# Patient Record
Sex: Female | Born: 1935 | Race: White | Hispanic: No | Smoking: Former smoker
Health system: Southern US, Community
[De-identification: ages and names within clinical notes are randomized; demographics above are authoritative.]

## PROBLEM LIST (undated history)

## (undated) DIAGNOSIS — S82891A Other fracture of right lower leg, initial encounter for closed fracture: Secondary | ICD-10-CM

## (undated) DIAGNOSIS — C801 Malignant (primary) neoplasm, unspecified: Secondary | ICD-10-CM

## (undated) HISTORY — DX: Malignant (primary) neoplasm, unspecified: C80.1

## (undated) HISTORY — DX: Other fracture of right lower leg, initial encounter for closed fracture: S82.891A

## (undated) HISTORY — PX: ABDOMINAL HYSTERECTOMY: SHX81

---

## 1982-06-24 DIAGNOSIS — S82891A Other fracture of right lower leg, initial encounter for closed fracture: Secondary | ICD-10-CM

## 1982-06-24 HISTORY — DX: Other fracture of right lower leg, initial encounter for closed fracture: S82.891A

## 2003-06-25 HISTORY — PX: EYE SURGERY: SHX253

## 2004-06-24 DIAGNOSIS — C801 Malignant (primary) neoplasm, unspecified: Secondary | ICD-10-CM

## 2004-06-24 HISTORY — DX: Malignant (primary) neoplasm, unspecified: C80.1

## 2011-09-04 ENCOUNTER — Encounter: Payer: Self-pay | Admitting: Internal Medicine

## 2011-09-04 ENCOUNTER — Ambulatory Visit (INDEPENDENT_AMBULATORY_CARE_PROVIDER_SITE_OTHER): Payer: Medicare Other | Admitting: Internal Medicine

## 2011-09-04 DIAGNOSIS — R32 Unspecified urinary incontinence: Secondary | ICD-10-CM | POA: Insufficient documentation

## 2011-09-04 DIAGNOSIS — G5630 Lesion of radial nerve, unspecified upper limb: Secondary | ICD-10-CM

## 2011-09-04 DIAGNOSIS — E1165 Type 2 diabetes mellitus with hyperglycemia: Secondary | ICD-10-CM | POA: Insufficient documentation

## 2011-09-04 DIAGNOSIS — I83003 Varicose veins of unspecified lower extremity with ulcer of ankle: Secondary | ICD-10-CM

## 2011-09-04 DIAGNOSIS — Z85118 Personal history of other malignant neoplasm of bronchus and lung: Secondary | ICD-10-CM

## 2011-09-04 DIAGNOSIS — IMO0002 Reserved for concepts with insufficient information to code with codable children: Secondary | ICD-10-CM | POA: Insufficient documentation

## 2011-09-04 DIAGNOSIS — M810 Age-related osteoporosis without current pathological fracture: Secondary | ICD-10-CM | POA: Insufficient documentation

## 2011-09-04 DIAGNOSIS — E559 Vitamin D deficiency, unspecified: Secondary | ICD-10-CM

## 2011-09-04 DIAGNOSIS — L97309 Non-pressure chronic ulcer of unspecified ankle with unspecified severity: Secondary | ICD-10-CM

## 2011-09-04 DIAGNOSIS — E119 Type 2 diabetes mellitus without complications: Secondary | ICD-10-CM

## 2011-09-04 DIAGNOSIS — S82891A Other fracture of right lower leg, initial encounter for closed fracture: Secondary | ICD-10-CM | POA: Insufficient documentation

## 2011-09-04 MED ORDER — "AQUACEL FOAM 4""X4"" EX PADS": 1.0000 "application " | MEDICATED_PAD | CUTANEOUS | Status: DC | PRN
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
## 2011-09-04 NOTE — Patient Instructions (Addendum)
I am concerned about your low morning glucose .Marland Kitchen Add 1 ounce of cheese or an egg white to your breakfast and don't take your insulin until just after you eat .  Sandwich thins are lower in carbohydrates than regular bread slices.   Return at your lesiure for fasting labs..  Referrals to Wound Care  [facility],  And [facility] are in process for you.   You should keep your wound out of the bathtub and shower an duse the aquacel dressings to keep the mositure balanced until the wound care cneter can see you

## 2011-09-05 DIAGNOSIS — Z85118 Personal history of other malignant neoplasm of bronchus and lung: Secondary | ICD-10-CM | POA: Insufficient documentation

## 2011-09-05 DIAGNOSIS — L97909 Non-pressure chronic ulcer of unspecified part of unspecified lower leg with unspecified severity: Secondary | ICD-10-CM | POA: Insufficient documentation

## 2011-09-05 NOTE — Assessment & Plan Note (Signed)
She is requesting referral to urogynecology for persistent incontinence. She does have a history of cystocele.

## 2011-09-05 NOTE — Assessment & Plan Note (Signed)
Her venous ulcer is macerated and chronic secondary to lack of moisture balance and persistent edema. I have ordered aqua cell antigens were used and they recommended that she avoid getting her leg wet in the shower or in the bathtub to protect it with a plastic bag.  It concerns me that that this has not been biopsied before since it has been chronic for many years. I'm referring her to the wound care center for assessment and treatment.

## 2011-09-05 NOTE — Assessment & Plan Note (Signed)
Managed with bisphosphonate therapy. She has been taking medication for 3 years and her son and daughter-in-law decided to stop her therapy at this time. We will get a baseline DEXA scan this year.

## 2011-09-05 NOTE — Assessment & Plan Note (Signed)
She has been no palsy of the right hand which occurred approximately one year ago during compression event overnight. Unfortunately she did not have physical therapy at the time and appears to have a contraction of the thumb resulting from the thenar muscle atrophy. Nevertheless she is requesting a neurology consult which we will obtain at [facility] since she is being referred to [facility] for urogynecologic issues as well.

## 2011-09-05 NOTE — Assessment & Plan Note (Signed)
Managed with sliding scale insulin 3 times daily and Lantus. Have addressed her overzealous management and warned her that episodes of hypoglycemia or more damaging at this point here I have been hyper. Hemoglobin A1c ordered for today

## 2011-09-05 NOTE — Assessment & Plan Note (Signed)
Etiology unclear, but  given her history of treatment with chemotherapy and radiation as opposed to resection I suspect this was small cell. Her oncologist is Dr. Ree Kida.  Will referred to Christian Mate at [facility] for followup.

## 2011-09-17 ENCOUNTER — Encounter: Payer: Self-pay | Admitting: Nurse Practitioner

## 2011-09-17 ENCOUNTER — Encounter: Payer: Self-pay | Admitting: Cardiothoracic Surgery

## 2011-09-19 ENCOUNTER — Encounter: Payer: Self-pay | Admitting: Internal Medicine

## 2011-09-23 ENCOUNTER — Encounter: Payer: Self-pay | Admitting: Cardiothoracic Surgery

## 2011-09-23 ENCOUNTER — Encounter: Payer: Self-pay | Admitting: Nurse Practitioner

## 2011-09-25 LAB — WOUND CULTURE

## 2011-09-27 ENCOUNTER — Telehealth: Payer: Self-pay | Admitting: Internal Medicine

## 2011-09-27 ENCOUNTER — Encounter: Payer: Self-pay | Admitting: Internal Medicine

## 2011-09-27 NOTE — Telephone Encounter (Signed)
Patients son called stated there were some labs you wanted patient to have done.  He wanted to know if she could have them done at [facility] [place].  Please advise and I will send the order to [facility].

## 2011-09-27 NOTE — Telephone Encounter (Signed)
Hgba1c, urine micro alb /Cr ratio,  CMET , TSH , CBC and fasting lipids

## 2011-09-30 NOTE — Telephone Encounter (Signed)
Letter has been printed, and sent to [facility]

## 2011-10-03 ENCOUNTER — Telehealth: Payer: Self-pay | Admitting: Internal Medicine

## 2011-10-03 MED ORDER — INSULIN GLARGINE 100 UNIT/ML ~~LOC~~ SOLN: 20.0000 [IU] | Freq: Every day | SUBCUTANEOUS | Status: DC

## 2011-10-03 NOTE — Telephone Encounter (Signed)
Recent labs reviewed, Her diabetes is not well controlled. hgba1c is 8.1 I would like her to change her lantus dose to 20 units daily instead of 10 units and 5 units.  It does not need to be given in 2 doses

## 2011-10-04 ENCOUNTER — Encounter: Payer: Self-pay | Admitting: Internal Medicine

## 2011-10-04 ENCOUNTER — Ambulatory Visit (INDEPENDENT_AMBULATORY_CARE_PROVIDER_SITE_OTHER): Payer: Medicare Other | Admitting: Internal Medicine

## 2011-10-04 VITALS — BP 122/66 | HR 104 | Temp 98.2°F | Resp 18 | Ht 62.0 in | Wt 103.5 lb

## 2011-10-04 DIAGNOSIS — F341 Dysthymic disorder: Secondary | ICD-10-CM

## 2011-10-04 DIAGNOSIS — L97309 Non-pressure chronic ulcer of unspecified ankle with unspecified severity: Secondary | ICD-10-CM

## 2011-10-04 DIAGNOSIS — I83003 Varicose veins of unspecified lower extremity with ulcer of ankle: Secondary | ICD-10-CM

## 2011-10-04 DIAGNOSIS — F418 Other specified anxiety disorders: Secondary | ICD-10-CM

## 2011-10-04 DIAGNOSIS — E119 Type 2 diabetes mellitus without complications: Secondary | ICD-10-CM

## 2011-10-04 MED ORDER — ESCITALOPRAM OXALATE 10 MG PO TABS: 10.0000 mg | ORAL_TABLET | Freq: Every day | ORAL | Status: DC

## 2011-10-04 NOTE — Patient Instructions (Signed)
I am not changing your insulin dose for now but would like you to keep a log of your blood sugars and the times you are taking them.  If you continue to have early morning highs (> 150),  Please check a 3 am sugar to make sure you are  not bottoming out   Consider supplements like atkins bars or carnation instant breakfast drink (consider almond milk instead of regular)   We are trying lexapro to help your mood improve.  Start with 1/2 tablet for one week  Take after dinner, unless it interfere with sleep .   Increase to one full tablet after one week.

## 2011-10-04 NOTE — Telephone Encounter (Signed)
Patient notified at her office visit. 

## 2011-10-04 NOTE — Assessment & Plan Note (Addendum)
We discussed the use of medications to manage her persistent symptoms which occurred after her husband passed away in 12/10/2008. She has accepted the offer to for a trial of Lexapro . She will return in one month. She is to start this medication at half tablet daily for a week followed by full increased to one whole tablet at one week if tolerated.
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
## 2011-10-04 NOTE — Telephone Encounter (Signed)
Left message asking patient to return my call.

## 2011-10-06 ENCOUNTER — Encounter: Payer: Self-pay | Admitting: Internal Medicine

## 2011-10-06 NOTE — Assessment & Plan Note (Signed)
Given her recurrent lows and poor eating habits I haven't opted not to change her medications today I have asked to return with the log of her meals and blood sugars and when I see her again in one month we might make some adjustments to her regimen.

## 2011-10-06 NOTE — Assessment & Plan Note (Addendum)
Improved with management  By the [facility] Wound healing center  after initial evaluation here.

## 2011-10-11 ENCOUNTER — Telehealth: Payer: Self-pay | Admitting: Internal Medicine

## 2011-10-11 ENCOUNTER — Encounter: Payer: Self-pay | Admitting: Internal Medicine

## 2011-10-11 NOTE — Telephone Encounter (Signed)
I recommend suspending the medication for a week and resuming at 1/4 tablet daily after that,.  If symptoms recur,.  Stop it.

## 2011-10-11 NOTE — Telephone Encounter (Signed)
Patients son called and stated he was concerned over the Lexapro that was prescribed.  He stated it is making the patient extremely fatigued and she has had some hand numbness.  I advised to stop taking the medication, he stated she definitely needs something but not the Lexapro.  Please advise.

## 2011-10-11 NOTE — Telephone Encounter (Signed)
[facility] Triage Call Report Triage Record Num: [id number] Operator: Patriciaann Clan Patient Name: Penny Clements Call Date & Time: 10/11/2011 10:10:10AM Patient Phone: [phone] PCP: Duncan Dull Patient Gender: Female PCP Fax : [phone] Patient DOB: 1928/01/12 Practice Name: [facility] Reason for Call: Caller: Diane/Daughter-in-law; PCP: Duncan Dull; CB#: [phone]; Daughter-in-law calling. States patient was started on Lexapro 5mg . daily on 10/04/11. States increased fatigue noted after starting Lexapro. States patient developed numbness of hand/hands, (Caller unsure if unilateral or bilateral) onset 10/09/11. No weakness of extremities. No facial drooping. Patient ambulating without difficulty. Denies headache. States patient did not take Lexapro 10/10/11 due to sx. Daughter-in-law is uncertain if sx improved after with holding dosage. Triage per Numbness or Tingling Protocol. No emergent sx identified. Care advice given per guidelines. Call back parameters reviewed. Daughter-in-law verbalizes understanding. DAUGHTER-IN-LAW CALLING REGARDING PATINT DEVELOPING INCREASED FATIGUE AND NUMBNESS OF HAND/HANDS (CALLER UNSURE IF UNILATERAL OR BILATERAL) AFTER STARTING LEXAPRO 5MG . STATES DOSAGE OF LEXAPRO WAS HELD 10/10/11. DAUGHTER-IN-LAW REQUESTING RETURN CALL 07/13/11 WITH INSTRUCTIONS REGARDING LEXAPRO. DAUGHTER-IN-LAW CAN BE REACHED @ [phone]. Message sent to Dr. Darrick Huntsman via Epic EHR. Protocol(s) Used: Numbness or Tingling Recommended Outcome per Protocol: See Provider within 24 hours Reason for Outcome: Symptoms began after starting or changing dose of prescription, nonprescription, alternative medication, or illicit drug Care Advice: Call provider immediately if numbness or tingling suddenly worsen or cause inability to perform activities of daily living. ~ ~ SYMPTOM / CONDITION MANAGEMENT 10/11/2011 10:51:25AM Page 1 of 1 [id number]

## 2011-10-11 NOTE — Telephone Encounter (Signed)
Caller: Diane/Daughter-in-law; PCP: Duncan Dull; CB#: [phone];   Daughter-in-law calling. States patient was started on Lexapro 5mg . daily on 10/04/11. States increased fatigue noted after starting Lexapro. States patient developed numbness of hand/hands, (Caller unsure if unilateral or bilateral) onset 10/09/11. No weakness of extremities. No facial drooping. Patient ambulating without difficulty.  Denies headache. States patient did not take Lexapro 10/10/11 due to sx. Daughter-in-law is uncertain if sx improved after with holding dosage. Triage per Numbness or Tingling Protocol. No emergent sx identified. Care advice given per guidelines. Call back parameters reviewed. Daughter-in-law verbalizes understanding.  DAUGHTER-IN-LAW CALLING REGARDING PATINT DEVELOPING INCREASED FATIGUE AND NUMBNESS OF HAND/HANDS (CALLER UNSURE IF UNILATERAL OR BILATERAL) AFTER STARTING LEXAPRO 5MG . STATES DOSAGE OF LEXAPRO WAS HELD 10/10/11. DAUGHTER-IN-LAW REQUESTING RETURN CALL 07/13/11 WITH INSTRUCTIONS REGARDING LEXAPRO. DAUGHTER-IN-LAW CAN BE REACHED @ [phone].

## 2011-10-11 NOTE — Telephone Encounter (Signed)
Patient's son notified.

## 2011-10-23 ENCOUNTER — Encounter: Payer: Self-pay | Admitting: Cardiothoracic Surgery

## 2011-10-23 ENCOUNTER — Encounter: Payer: Self-pay | Admitting: Nurse Practitioner

## 2011-11-01 ENCOUNTER — Encounter: Payer: Self-pay | Admitting: Internal Medicine

## 2011-11-01 ENCOUNTER — Ambulatory Visit (INDEPENDENT_AMBULATORY_CARE_PROVIDER_SITE_OTHER): Payer: Medicare Other | Admitting: Internal Medicine

## 2011-11-01 VITALS — BP 118/56 | HR 89 | Temp 98.1°F | Resp 18 | Wt 109.2 lb

## 2011-11-01 DIAGNOSIS — L97309 Non-pressure chronic ulcer of unspecified ankle with unspecified severity: Secondary | ICD-10-CM

## 2011-11-01 DIAGNOSIS — I83003 Varicose veins of unspecified lower extremity with ulcer of ankle: Secondary | ICD-10-CM

## 2011-11-01 DIAGNOSIS — C801 Malignant (primary) neoplasm, unspecified: Secondary | ICD-10-CM | POA: Insufficient documentation

## 2011-11-01 DIAGNOSIS — F341 Dysthymic disorder: Secondary | ICD-10-CM

## 2011-11-01 DIAGNOSIS — R32 Unspecified urinary incontinence: Secondary | ICD-10-CM

## 2011-11-01 DIAGNOSIS — F418 Other specified anxiety disorders: Secondary | ICD-10-CM

## 2011-11-01 DIAGNOSIS — Z87898 Personal history of other specified conditions: Secondary | ICD-10-CM
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
## 2011-11-01 NOTE — Assessment & Plan Note (Signed)
Improving , nearly resolved with Wound management and leg elevation per [facility]

## 2011-11-01 NOTE — Assessment & Plan Note (Addendum)
Lexapro initiatied last visit.  She is taking 2.5 mg daily for the past 2 weeks, we discussed continuing current dose for 2 weeks more, if still fatigued,  Will try mirtazipine  7.5 mg daily

## 2011-11-01 NOTE — Assessment & Plan Note (Signed)
Combination stress/urge with cystocele .  Referral to [facility] Urogynecology discussed.   Son will handle.

## 2011-11-14 ENCOUNTER — Encounter: Payer: Self-pay | Admitting: Internal Medicine

## 2011-11-15 ENCOUNTER — Telehealth: Payer: Self-pay | Admitting: *Deleted

## 2011-11-15 DIAGNOSIS — F329 Major depressive disorder, single episode, unspecified: Secondary | ICD-10-CM

## 2011-11-15 MED ORDER — MIRTAZAPINE 7.5 MG PO TABS: 7.5000 mg | ORAL_TABLET | Freq: Every day | ORAL | Status: DC

## 2011-11-15 NOTE — Telephone Encounter (Signed)
Son called about email sent through My Chart: [can't print] [facility] MESSAGE REPORT  Message [[id number]]    From  Herschell Dimes   To  Sherlene Shams, MD [P [phone]   Composed  11/14/2011 5:15 PM   For Delivery On  11/14/2011 5:15 PM   Subject  Visit Follow-Up Question   Message Type  Patient Medical Advice Request   Read Status  N   Message Body  I'm frustrated by the difficulty in getting through with questions. I called this afternoon around 1:30 and received a call back 20 minutes later which went to voicemail. I called back immediately and left another message but did not hear back. At 4:30 I called again and left a message and did not hear back. I called at 5 and got the message that the office is closed.   I understand that the office is busy with patients, but there needs to be a more efficient way to get through with non-emergency follow up questions. In this case my mother is severely depressed and we need to determine medication changes and look into counseling. I'll call again tomorrow.   --Frederik Pear

## 2011-11-16 ENCOUNTER — Encounter: Payer: Self-pay | Admitting: Internal Medicine

## 2011-11-19 NOTE — Telephone Encounter (Signed)
I have called son and left vm asking him to return my call.  I have spoke with the [facility] and they stated that on Thursday Ashley W. Called and left vm once and then patient called back after she had left.  Jasmine December states that she took phone message off machine on Friday when she sent Dr. Darrick Huntsman a note since son wanted to talk with her.

## 2011-11-20 ENCOUNTER — Telehealth: Payer: Self-pay | Admitting: Internal Medicine

## 2011-11-20 NOTE — Telephone Encounter (Signed)
Dorene Sorrow patients son called and left a message on my phone that he was returning your call.  I called him back and left him a voicemail to let him know you had left for the evening and you would return his call tomorrow.

## 2011-11-23 ENCOUNTER — Encounter: Payer: Self-pay | Admitting: Cardiothoracic Surgery

## 2011-11-23 ENCOUNTER — Encounter: Payer: Self-pay | Admitting: Nurse Practitioner

## 2011-11-25 NOTE — Telephone Encounter (Signed)
I finally spoke with Dorene Sorrow and he said that the issue has been resolved from last week.  I did give him my direct extension and I would be glad to make sure he got a return call.  He took down the number and said he would call my extension when he had a question.

## 2012-01-24 ENCOUNTER — Ambulatory Visit: Payer: Medicare Other | Admitting: Internal Medicine

## 2012-01-24 ENCOUNTER — Ambulatory Visit (INDEPENDENT_AMBULATORY_CARE_PROVIDER_SITE_OTHER): Payer: Medicare Other | Admitting: Internal Medicine

## 2012-01-24 ENCOUNTER — Encounter: Payer: Self-pay | Admitting: Internal Medicine

## 2012-01-24 VITALS — BP 140/72 | HR 110 | Temp 98.1°F | Resp 20 | Wt 106.5 lb

## 2012-01-24 DIAGNOSIS — J40 Bronchitis, not specified as acute or chronic: Secondary | ICD-10-CM

## 2012-01-24 MED ORDER — BECLOMETHASONE DIPROPIONATE 40 MCG/ACT IN AERS: 2.0000 | INHALATION_SPRAY | Freq: Two times a day (BID) | RESPIRATORY_TRACT | Status: DC

## 2012-01-24 MED ORDER — AZITHROMYCIN 250 MG PO TABS: ORAL_TABLET | ORAL | Status: AC
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
## 2012-01-24 NOTE — Patient Instructions (Addendum)
Use delsym for the cough suppressant.   I am going to prescribe an antibioitc and an inhaler to use for your wheezing (one puff twice daily)   Flush your sinuses twice daily with Simply Saline   Rise your mouth after you use the inhaler, to prevent thrush (yeast infection of the tongue)

## 2012-01-26 ENCOUNTER — Encounter: Payer: Self-pay | Admitting: Internal Medicine

## 2012-01-26 DIAGNOSIS — J40 Bronchitis, not specified as acute or chronic: Secondary | ICD-10-CM | POA: Insufficient documentation

## 2012-01-26 NOTE — Assessment & Plan Note (Signed)
Given her persistent symptoms, wheezing on exam, and purulent sputum we'll treat with azithromycin and steroid inhaler for management of mild wheezing.

## 2012-02-28 ENCOUNTER — Encounter: Payer: Self-pay | Admitting: Internal Medicine

## 2012-02-28 ENCOUNTER — Telehealth: Payer: Self-pay | Admitting: Internal Medicine

## 2012-02-28 NOTE — Telephone Encounter (Signed)
[employer] Medical form for Diabetic testing supplies put in Dr. Melina Schools tray to be completed and faxed. Patient is almost out of supplies.

## 2012-03-03 ENCOUNTER — Telehealth: Payer: Self-pay | Admitting: Internal Medicine

## 2012-03-03 ENCOUNTER — Encounter: Payer: Self-pay | Admitting: Internal Medicine

## 2012-03-03 NOTE — Telephone Encounter (Signed)
Fine, 5 times a day  .  And she is overdue for her hgba1c which was done in April.

## 2012-03-03 NOTE — Telephone Encounter (Signed)
I called patient to let her know I faxed the form for her diabetic supplies and let her know that per Dr. Darrick Huntsman she should not be testing her blood sugar more than 3 times a day (she is currently testing 5 times a day).  She stated she has been testing this way since the early 90s and she will not change this now.  She stated she has to test her blood sugars 5 times a day because they fluctuate so much.  She kept saying "I can't change, I'm sorry but I can't", "I am comfortable testing that way", I intend to continue like this until I die".

## 2012-03-04 NOTE — Telephone Encounter (Signed)
Patient notified.  She will call back to schedule the lab appt.

## 2012-03-11 ENCOUNTER — Telehealth: Payer: Self-pay | Admitting: Internal Medicine

## 2012-03-11 ENCOUNTER — Encounter: Payer: Self-pay | Admitting: Internal Medicine

## 2012-03-11 MED ORDER — "INSULIN SYRINGE-NEEDLE U-100 31G X 5/16"" 1 ML MISC": Status: DC

## 2012-03-11 NOTE — Telephone Encounter (Signed)
Recent labs reviewed.  hgba1c 7.6  is better,   Renal function is stable.

## 2012-03-12 NOTE — Telephone Encounter (Signed)
Patient read My Chart message.

## 2012-03-20 ENCOUNTER — Encounter: Payer: Self-pay | Admitting: Internal Medicine

## 2012-03-31 ENCOUNTER — Encounter: Payer: Self-pay | Admitting: Internal Medicine

## 2012-04-01 ENCOUNTER — Encounter: Payer: Self-pay | Admitting: Internal Medicine

## 2012-04-09 ENCOUNTER — Encounter: Payer: Self-pay | Admitting: Internal Medicine

## 2012-04-09 DIAGNOSIS — M6281 Muscle weakness (generalized): Secondary | ICD-10-CM

## 2012-04-14 ENCOUNTER — Ambulatory Visit (INDEPENDENT_AMBULATORY_CARE_PROVIDER_SITE_OTHER): Payer: Medicare Other | Admitting: Internal Medicine

## 2012-04-14 ENCOUNTER — Encounter: Payer: Self-pay | Admitting: Internal Medicine

## 2012-04-14 VITALS — BP 118/62 | HR 95 | Temp 97.5°F | Ht 62.5 in | Wt 109.5 lb

## 2012-04-14 DIAGNOSIS — L8990 Pressure ulcer of unspecified site, unspecified stage: Secondary | ICD-10-CM

## 2012-04-14 DIAGNOSIS — L89609 Pressure ulcer of unspecified heel, unspecified stage: Secondary | ICD-10-CM

## 2012-04-14 DIAGNOSIS — I83009 Varicose veins of unspecified lower extremity with ulcer of unspecified site: Secondary | ICD-10-CM

## 2012-04-14 MED ORDER — CEPHALEXIN 500 MG PO CAPS: 500.0000 mg | ORAL_CAPSULE | Freq: Three times a day (TID) | ORAL | Status: DC

## 2012-04-14 NOTE — Patient Instructions (Addendum)
Please continue to eat yogurt and add align, one capsule daily while you are taking the antibiotic (cephalexin)
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
## 2012-04-16 ENCOUNTER — Encounter: Payer: Self-pay | Admitting: Internal Medicine

## 2012-04-16 DIAGNOSIS — L89609 Pressure ulcer of unspecified heel, unspecified stage: Secondary | ICD-10-CM | POA: Insufficient documentation

## 2012-04-16 NOTE — Assessment & Plan Note (Signed)
Stage 1 .  Advised to float heels while resting in recliner. Continue duoderm dressing pending Wound Care evaluation

## 2012-04-16 NOTE — Assessment & Plan Note (Signed)
Stage 3 at the least,  appears to have tendon exposure.  Given the erythema surrounding the wound, I have prescribed cephalexin x 7 days and advised to suspend use of compression stockings until wound heals.  Wound Care referral made.

## 2012-04-21 ENCOUNTER — Encounter: Payer: Self-pay | Admitting: Nurse Practitioner

## 2012-04-21 ENCOUNTER — Encounter: Payer: Self-pay | Admitting: Cardiothoracic Surgery

## 2012-04-24 ENCOUNTER — Encounter: Payer: Self-pay | Admitting: Cardiothoracic Surgery

## 2012-04-24 ENCOUNTER — Encounter: Payer: Self-pay | Admitting: Nurse Practitioner

## 2012-05-24 ENCOUNTER — Encounter: Payer: Self-pay | Admitting: Nurse Practitioner

## 2012-05-24 ENCOUNTER — Encounter: Payer: Self-pay | Admitting: Cardiothoracic Surgery

## 2012-07-20 ENCOUNTER — Other Ambulatory Visit: Payer: Self-pay | Admitting: *Deleted

## 2012-07-21 MED ORDER — DILTIAZEM HCL ER 120 MG PO CP24: 120.0000 mg | ORAL_CAPSULE | Freq: Every day | ORAL | Status: DC

## 2012-07-21 NOTE — Telephone Encounter (Signed)
Med filled.  

## 2012-08-08 ENCOUNTER — Other Ambulatory Visit: Payer: Self-pay

## 2012-08-14 ENCOUNTER — Telehealth: Payer: Self-pay | Admitting: General Practice

## 2012-08-14 ENCOUNTER — Encounter: Payer: Self-pay | Admitting: General Practice

## 2012-08-14 NOTE — Telephone Encounter (Signed)
Yes to both.  Please give them a verbal

## 2012-08-14 NOTE — Telephone Encounter (Signed)
Letter written and faxed.

## 2012-08-14 NOTE — Telephone Encounter (Signed)
[facility] called regarding pt Hand Therapy that was written back in [facility]. Has expired they are wanting to know if it can be renewed due to hand weakness. Also pt is wanting therapy for balance issues.   Please fax to [phone]

## 2012-08-20 ENCOUNTER — Other Ambulatory Visit: Payer: Self-pay | Admitting: *Deleted

## 2012-08-21 ENCOUNTER — Encounter: Payer: Self-pay | Admitting: Cardiothoracic Surgery

## 2012-08-21 ENCOUNTER — Encounter: Payer: Self-pay | Admitting: Nurse Practitioner

## 2012-08-21 MED ORDER — INSULIN ASPART 100 UNIT/ML ~~LOC~~ SOLN: SUBCUTANEOUS | Status: DC

## 2012-08-21 MED ORDER — INSULIN GLARGINE 100 UNIT/ML ~~LOC~~ SOLN: 10.0000 [IU] | Freq: Every day | SUBCUTANEOUS | Status: DC

## 2012-08-21 NOTE — Telephone Encounter (Signed)
Med filled.  

## 2012-08-22 ENCOUNTER — Encounter: Payer: Self-pay | Admitting: Cardiothoracic Surgery

## 2012-08-22 ENCOUNTER — Encounter: Payer: Self-pay | Admitting: Nurse Practitioner

## 2012-08-24 ENCOUNTER — Telehealth: Payer: Self-pay | Admitting: *Deleted

## 2012-08-24 NOTE — Telephone Encounter (Signed)
thre patient takes 5 units daily and increases as needed

## 2012-08-24 NOTE — Telephone Encounter (Signed)
Received a fax from pt pharmacy:  Note:  Please clarify directions for the Insulin Glargine (lantus)   Is this 10 units in am?  5 units in pm?  Or 10 units total daily?  Fax: [phone]

## 2012-08-25 NOTE — Telephone Encounter (Signed)
Below called to pharmacy spoke with Amy.

## 2012-09-22 ENCOUNTER — Encounter: Payer: Self-pay | Admitting: Nurse Practitioner

## 2012-09-22 ENCOUNTER — Encounter: Payer: Self-pay | Admitting: Cardiothoracic Surgery

## 2012-09-23 ENCOUNTER — Encounter: Payer: Self-pay | Admitting: Internal Medicine

## 2012-09-28 LAB — WOUND CULTURE

## 2012-10-22 ENCOUNTER — Encounter: Payer: Self-pay | Admitting: Nurse Practitioner

## 2012-10-22 ENCOUNTER — Encounter: Payer: Self-pay | Admitting: Cardiothoracic Surgery

## 2012-10-22 ENCOUNTER — Encounter: Payer: Self-pay | Admitting: Internal Medicine

## 2012-11-05 LAB — HM DIABETES EYE EXAM

## 2012-11-22 ENCOUNTER — Encounter: Payer: Self-pay | Admitting: Nurse Practitioner

## 2012-11-22 ENCOUNTER — Encounter: Payer: Self-pay | Admitting: Cardiothoracic Surgery

## 2012-12-22 ENCOUNTER — Encounter: Payer: Self-pay | Admitting: Nurse Practitioner

## 2012-12-22 ENCOUNTER — Encounter: Payer: Self-pay | Admitting: Cardiothoracic Surgery

## 2013-01-27 ENCOUNTER — Other Ambulatory Visit: Payer: Self-pay

## 2013-03-03 ENCOUNTER — Encounter: Payer: Self-pay | Admitting: Surgery

## 2013-03-07 LAB — WOUND CULTURE

## 2013-03-24 ENCOUNTER — Encounter: Payer: Self-pay | Admitting: Surgery

## 2013-04-13 ENCOUNTER — Encounter: Payer: Self-pay | Admitting: Surgery

## 2013-04-24 ENCOUNTER — Encounter: Payer: Self-pay | Admitting: Surgery

## 2013-04-29 ENCOUNTER — Other Ambulatory Visit: Payer: Self-pay

## 2013-05-24 ENCOUNTER — Encounter: Payer: Self-pay | Admitting: Surgery

## 2013-05-27 ENCOUNTER — Other Ambulatory Visit: Payer: Self-pay | Admitting: Internal Medicine

## 2013-05-27 NOTE — Telephone Encounter (Signed)
Refill? Last visit 04/14/12

## 2013-05-27 NOTE — Telephone Encounter (Signed)
Patient cannot continue to receive refills on insulin without regular follow up .  Has not been seen in over one year.  Needs 30 minute appt asap ,  Can refill insulin for one month

## 2013-05-28 NOTE — Telephone Encounter (Signed)
See prior note.  One refill sent

## 2013-05-28 NOTE — Telephone Encounter (Signed)
No OV since 10/13 please advise as to refill

## 2013-06-01 ENCOUNTER — Telehealth: Payer: Self-pay | Admitting: Internal Medicine

## 2013-06-01 NOTE — Telephone Encounter (Signed)
Letter on printer

## 2013-06-01 NOTE — Telephone Encounter (Signed)
Pt left vm 12/8.  States it is time for her to have blood work.  Would like [place] to call [facility] with the order so Danie Chandler can draw the labs.

## 2013-06-01 NOTE — Telephone Encounter (Signed)
Please advise 

## 2013-06-01 NOTE — Telephone Encounter (Signed)
Faxed letter to [facility] attention Lawson Fiscal

## 2013-06-03 ENCOUNTER — Telehealth: Payer: Self-pay | Admitting: Internal Medicine

## 2013-06-03 NOTE — Telephone Encounter (Signed)
Pt called stating it is time for her to have a complete blood panel done.  She stated she can get this done @ the [facility] She needs ordered faxed there Please advise

## 2013-06-03 NOTE — Telephone Encounter (Signed)
Order has already been faxed. 

## 2013-06-07 LAB — HEPATIC FUNCTION PANEL
ALT: 10 U/L (ref 7–35)
AST: 18 U/L (ref 13–35)
Alkaline Phosphatase: 77 U/L (ref 25–125)
Bilirubin, Total: 0.4 mg/dL

## 2013-06-07 LAB — LIPID PANEL: Cholesterol: 181 mg/dL (ref 0–200)

## 2013-06-07 LAB — BASIC METABOLIC PANEL
BUN: 38 mg/dL — AB (ref 4–21)
Creatinine: 1.1 mg/dL (ref 0.5–1.1)
Glucose: 139 mg/dL
Potassium: 4.5 mmol/L (ref 3.4–5.3)

## 2013-06-07 LAB — CBC AND DIFFERENTIAL
Platelets: 318 10*3/uL (ref 150–399)
WBC: 6 10^3/mL

## 2013-06-07 LAB — HEMOGLOBIN A1C: Hgb A1c MFr Bld: 8 % — AB (ref 4.0–6.0)

## 2013-06-10 ENCOUNTER — Telehealth: Payer: Self-pay | Admitting: Internal Medicine

## 2013-06-10 NOTE — Telephone Encounter (Signed)
Left message to return call 

## 2013-06-10 NOTE — Telephone Encounter (Signed)
Patient's labs have been received, and we wil discuss at her visit which needs to be ASAP.  Please remind her that as a diabetic she needs to see me every 3 to 6 months ,  She has not been seen in over 1 year.  I will not continue to manage her diabetes and other problems if she cannot comply with this schedule.

## 2013-06-11 NOTE — Telephone Encounter (Signed)
Pt notified. 30 minute appt scheduled 07/05/13.

## 2013-06-24 ENCOUNTER — Encounter: Payer: Self-pay | Admitting: Surgery

## 2013-07-05 ENCOUNTER — Ambulatory Visit: Payer: Medicare Other | Admitting: Internal Medicine

## 2013-07-05 ENCOUNTER — Encounter: Payer: Self-pay | Admitting: Internal Medicine

## 2013-07-08 ENCOUNTER — Other Ambulatory Visit: Payer: Self-pay | Admitting: Internal Medicine

## 2013-07-08 NOTE — Telephone Encounter (Signed)
Appt 07/12/13 

## 2013-07-09 ENCOUNTER — Ambulatory Visit: Payer: Medicare Other | Admitting: Internal Medicine

## 2013-07-12 ENCOUNTER — Ambulatory Visit (INDEPENDENT_AMBULATORY_CARE_PROVIDER_SITE_OTHER): Payer: Medicare Other | Admitting: Internal Medicine

## 2013-07-12 ENCOUNTER — Encounter: Payer: Self-pay | Admitting: Internal Medicine

## 2013-07-12 VITALS — BP 128/78 | HR 119 | Temp 97.5°F | Resp 22 | Wt 94.8 lb

## 2013-07-12 DIAGNOSIS — F341 Dysthymic disorder: Secondary | ICD-10-CM

## 2013-07-12 DIAGNOSIS — D509 Iron deficiency anemia, unspecified: Secondary | ICD-10-CM

## 2013-07-12 DIAGNOSIS — R0989 Other specified symptoms and signs involving the circulatory and respiratory systems: Secondary | ICD-10-CM

## 2013-07-12 DIAGNOSIS — R0600 Dyspnea, unspecified: Secondary | ICD-10-CM

## 2013-07-12 DIAGNOSIS — R5381 Other malaise: Secondary | ICD-10-CM

## 2013-07-12 DIAGNOSIS — R634 Abnormal weight loss: Secondary | ICD-10-CM

## 2013-07-12 DIAGNOSIS — E119 Type 2 diabetes mellitus without complications: Secondary | ICD-10-CM

## 2013-07-12 DIAGNOSIS — C801 Malignant (primary) neoplasm, unspecified: Secondary | ICD-10-CM

## 2013-07-12 DIAGNOSIS — R0609 Other forms of dyspnea: Secondary | ICD-10-CM

## 2013-07-12 DIAGNOSIS — D649 Anemia, unspecified: Secondary | ICD-10-CM

## 2013-07-12 DIAGNOSIS — E559 Vitamin D deficiency, unspecified: Secondary | ICD-10-CM

## 2013-07-12 DIAGNOSIS — R5383 Other fatigue: Secondary | ICD-10-CM

## 2013-07-12 DIAGNOSIS — R000 Tachycardia, unspecified: Secondary | ICD-10-CM

## 2013-07-12 DIAGNOSIS — Z85118 Personal history of other malignant neoplasm of bronchus and lung: Secondary | ICD-10-CM

## 2013-07-12 DIAGNOSIS — F418 Other specified anxiety disorders: Secondary | ICD-10-CM

## 2013-07-12 DIAGNOSIS — I951 Orthostatic hypotension: Secondary | ICD-10-CM

## 2013-07-12 LAB — COMPREHENSIVE METABOLIC PANEL
ALT: 10 U/L (ref 0–35)
AST: 16 U/L (ref 0–37)
Albumin: 2.7 g/dL — ABNORMAL LOW (ref 3.5–5.2)
Alkaline Phosphatase: 99 U/L (ref 39–117)
BUN: 25 mg/dL — ABNORMAL HIGH (ref 6–23)
CO2: 29 mEq/L (ref 19–32)
Calcium: 9.4 mg/dL (ref 8.4–10.5)
Chloride: 101 mEq/L (ref 96–112)
Creatinine, Ser: 1.1 mg/dL (ref 0.4–1.2)
GFR: 49.78 mL/min — ABNORMAL LOW (ref >60.00)
GLUCOSE: 157 mg/dL — AB (ref 70–99)
Potassium: 4.8 mEq/L (ref 3.5–5.1)
SODIUM: 140 meq/L (ref 135–145)
TOTAL PROTEIN: 7.3 g/dL (ref 6.0–8.3)
Total Bilirubin: 0.7 mg/dL (ref 0.3–1.2)

## 2013-07-12 LAB — VITAMIN B12: VITAMIN B 12: 914 pg/mL — AB (ref 211–911)

## 2013-07-12 LAB — TSH: TSH: 2.1 u[IU]/mL (ref 0.35–5.50)

## 2013-07-12 LAB — MAGNESIUM: Magnesium: 1.8 mg/dL (ref 1.5–2.5)

## 2013-07-12 LAB — FERRITIN: Ferritin: 34.7 ng/mL (ref 10.0–291.0)

## 2013-07-12 LAB — FOLATE: Folate: 24 ng/mL (ref >5.9)

## 2013-07-12 MED ORDER — CITALOPRAM HYDROBROMIDE 10 MG PO TABS: 5.0000 mg | ORAL_TABLET | Freq: Every day | ORAL | Status: DC

## 2013-07-12 NOTE — Patient Instructions (Signed)
Your symptoms may be due to dehydration, depression,or  worsening anemia   I am sending you for a chest x ray and have ordered additional blood tests today to investigate  I am recommending you start celexa for your depression Start with 1/2 tablet daily for the first week, then increase to a full tablet Take in the evening after dinner

## 2013-07-12 NOTE — Assessment & Plan Note (Addendum)
Did not tolerate prior trials of lexapro and remeron.  Willing to try citalopram since it has been tolerated by a friend of hers. Low-dose started. Return 2 weeks.
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
## 2013-07-12 NOTE — Progress Notes (Signed)
Pre-visit discussion using our clinic review tool. No additional management support is needed unless otherwise documented below in the visit note.  

## 2013-07-13 ENCOUNTER — Encounter: Payer: Self-pay | Admitting: Internal Medicine

## 2013-07-13 DIAGNOSIS — D509 Iron deficiency anemia, unspecified: Secondary | ICD-10-CM | POA: Insufficient documentation

## 2013-07-13 LAB — IRON AND TIBC
%SAT: 10 % — AB (ref 20–55)
Iron: 25 ug/dL — ABNORMAL LOW (ref 42–145)
TIBC: 257 ug/dL (ref 250–470)
UIBC: 232 ug/dL (ref 125–400)

## 2013-07-13 LAB — VITAMIN D 25 HYDROXY (VIT D DEFICIENCY, FRACTURES): VIT D 25 HYDROXY: 48 ng/mL (ref 30–89)

## 2013-07-13 MED ORDER — FERROUS SULFATE 324 (65 FE) MG PO TBEC: 1.0000 | DELAYED_RELEASE_TABLET | Freq: Every day | ORAL | Status: DC

## 2013-07-13 NOTE — Assessment & Plan Note (Addendum)
She is orthostatic today on my exam. Unclear whether this is due to dehydration, worsening anemia,  or autonomic neuropathy .  She has a history of diabetes and hypertension but has stopped all her medications independently except for her diabetes medications.  Lab Results  Component Value Date   HGB 9.0* 06/07/2013   Lab Results  Component Value Date   CREATININE 1.1 07/12/2013

## 2013-07-13 NOTE — Assessment & Plan Note (Signed)
Recent blood work noted a hemoglobin of 9. B12 folate and thyroid have been ordered along with iron studies. Take home stool test is also been given to patient. She appears to have a mild iron deficiency given her ferritin of 34 and iron saturation 25 Lab Results  Component Value Date   IRON 25* 07/12/2013   TIBC 257 07/12/2013   FERRITIN 34.7 07/12/2013   Lab Results  Component Value Date   VITAMINB12 914* 07/12/2013   Lab Results  Component Value Date   FOLATE 24.0 07/12/2013   Lab Results  Component Value Date   TSH 2.10 07/12/2013

## 2013-07-13 NOTE — Assessment & Plan Note (Signed)
Given her recurrent lows and poor eating habits I haven't opted not to change her medications today I have asked to return with the log of her meals and blood sugars and when I see her again in two week so we might make some adjustments to her regimen.  Lab Results  Component Value Date   HGBA1C 8.0* 06/07/2013   No results found for this basename: MICROALBUR, [id number]

## 2013-07-13 NOTE — Assessment & Plan Note (Signed)
Difficult to say whether this is due to untreated depression or return of malignancy given her history of both. Plain films of chest ordered. Prior trial of mirtazapine was not tolerated. Trial of this citalopram Lab Results  Component Value Date   ALT 10 07/12/2013   AST 16 07/12/2013   ALKPHOS 99 07/12/2013   BILITOT 0.7 07/12/2013

## 2013-07-13 NOTE — Assessment & Plan Note (Signed)
Given her history of weight loss and cough plain x-ray has been ordered chest to rule out recurrence

## 2013-07-14 ENCOUNTER — Ambulatory Visit (INDEPENDENT_AMBULATORY_CARE_PROVIDER_SITE_OTHER)
Admission: RE | Admit: 2013-07-14 | Discharge: 2013-07-14 | Disposition: A | Discharge status: Home / Self Care | Payer: Medicare Other | Source: Ambulatory Visit | Attending: Internal Medicine | Admitting: Internal Medicine

## 2013-07-14 ENCOUNTER — Encounter: Payer: Self-pay | Admitting: *Deleted

## 2013-07-14 ENCOUNTER — Other Ambulatory Visit (INDEPENDENT_AMBULATORY_CARE_PROVIDER_SITE_OTHER): Payer: Medicare Other

## 2013-07-14 ENCOUNTER — Encounter: Payer: Self-pay | Admitting: Internal Medicine

## 2013-07-14 DIAGNOSIS — Z139 Encounter for screening, unspecified: Secondary | ICD-10-CM

## 2013-07-14 DIAGNOSIS — Z85118 Personal history of other malignant neoplasm of bronchus and lung: Secondary | ICD-10-CM

## 2013-07-15 ENCOUNTER — Encounter: Payer: Self-pay | Admitting: Internal Medicine

## 2013-07-15 LAB — URINALYSIS, ROUTINE W REFLEX MICROSCOPIC
BILIRUBIN URINE: NEGATIVE
Hgb urine dipstick: NEGATIVE
Ketones, ur: NEGATIVE
Leukocytes, UA: NEGATIVE
RBC / HPF: NONE SEEN (ref 0–?)
Specific Gravity, Urine: >=1.03 — AB (ref 1.000–1.030)
TOTAL PROTEIN, URINE-UPE24: 30 — AB
URINE GLUCOSE: NEGATIVE
Urobilinogen, UA: 0.2 (ref 0.0–1.0)
WBC UA: NONE SEEN (ref 0–?)
pH: 5.5 (ref 5.0–8.0)

## 2013-07-16 ENCOUNTER — Telehealth: Payer: Self-pay | Admitting: *Deleted

## 2013-07-16 NOTE — Telephone Encounter (Signed)
Patient niece bringing in stool sample FYI

## 2013-07-21 ENCOUNTER — Other Ambulatory Visit: Payer: Medicare Other

## 2013-07-26 ENCOUNTER — Ambulatory Visit (INDEPENDENT_AMBULATORY_CARE_PROVIDER_SITE_OTHER): Payer: Medicare Other | Admitting: Internal Medicine

## 2013-07-26 ENCOUNTER — Encounter: Payer: Self-pay | Admitting: Internal Medicine

## 2013-07-26 ENCOUNTER — Other Ambulatory Visit: Payer: Self-pay | Admitting: Internal Medicine

## 2013-07-26 ENCOUNTER — Other Ambulatory Visit: Payer: Medicare Other

## 2013-07-26 VITALS — BP 100/64 | HR 106 | Temp 97.7°F | Resp 22 | Ht 62.5 in | Wt 96.0 lb

## 2013-07-26 DIAGNOSIS — Z1211 Encounter for screening for malignant neoplasm of colon: Secondary | ICD-10-CM

## 2013-07-26 DIAGNOSIS — IMO0002 Reserved for concepts with insufficient information to code with codable children: Secondary | ICD-10-CM

## 2013-07-26 DIAGNOSIS — F418 Other specified anxiety disorders: Secondary | ICD-10-CM

## 2013-07-26 DIAGNOSIS — D509 Iron deficiency anemia, unspecified: Secondary | ICD-10-CM

## 2013-07-26 DIAGNOSIS — R634 Abnormal weight loss: Secondary | ICD-10-CM

## 2013-07-26 DIAGNOSIS — E1165 Type 2 diabetes mellitus with hyperglycemia: Secondary | ICD-10-CM

## 2013-07-26 DIAGNOSIS — Z85118 Personal history of other malignant neoplasm of bronchus and lung: Secondary | ICD-10-CM

## 2013-07-26 DIAGNOSIS — F341 Dysthymic disorder: Secondary | ICD-10-CM

## 2013-07-26 DIAGNOSIS — IMO0001 Reserved for inherently not codable concepts without codable children: Secondary | ICD-10-CM

## 2013-07-26 LAB — FECAL OCCULT BLOOD, IMMUNOCHEMICAL: FECAL OCCULT BLD: NEGATIVE

## 2013-07-26 NOTE — Progress Notes (Signed)
Pre-visit discussion using our clinic review tool. No additional management support is needed unless otherwise documented below in the visit note.  

## 2013-07-26 NOTE — Patient Instructions (Addendum)
I suggest you start the citalopram to help you enjoy yourself  I agree with worrying less about your diabetes!   You can resume the iron supplement once daily after a meal and use the stool softener once or twice daily   Return in one month
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
## 2013-07-28 ENCOUNTER — Encounter: Payer: Self-pay | Admitting: Internal Medicine

## 2013-07-28 NOTE — Assessment & Plan Note (Signed)
Long discussion about why she did not start the citalopram as we discussed last visit.  She reports being confortable being at the end of her life, and being ready to die but was tearful when discussing it.  Has finally agreed to trial of citalopam.

## 2013-07-28 NOTE — Assessment & Plan Note (Signed)
FOBT was negative and she does not want further workup.  Resume iron supplements.

## 2013-07-28 NOTE — Assessment & Plan Note (Signed)
She has lost control of her diabetes despite continuing to check her blood sugars 5 times daily . Suggested that she start checking her blood sugars less often to avoid complicating her anxiety.

## 2013-07-28 NOTE — Assessment & Plan Note (Signed)
Appears to be secondary to depression and anxiety .  No further workup for occult malignancy.

## 2013-08-02 ENCOUNTER — Other Ambulatory Visit: Payer: Self-pay | Admitting: Internal Medicine

## 2013-08-23 ENCOUNTER — Encounter: Payer: Self-pay | Admitting: Internal Medicine

## 2013-08-23 ENCOUNTER — Ambulatory Visit (INDEPENDENT_AMBULATORY_CARE_PROVIDER_SITE_OTHER): Payer: Medicare Other | Admitting: Internal Medicine

## 2013-08-23 VITALS — BP 126/56 | HR 115 | Temp 97.5°F | Resp 18 | Wt 94.2 lb

## 2013-08-23 DIAGNOSIS — E119 Type 2 diabetes mellitus without complications: Secondary | ICD-10-CM

## 2013-08-23 DIAGNOSIS — IMO0002 Reserved for concepts with insufficient information to code with codable children: Secondary | ICD-10-CM

## 2013-08-23 DIAGNOSIS — D649 Anemia, unspecified: Secondary | ICD-10-CM

## 2013-08-23 DIAGNOSIS — Z23 Encounter for immunization: Secondary | ICD-10-CM

## 2013-08-23 DIAGNOSIS — F341 Dysthymic disorder: Secondary | ICD-10-CM

## 2013-08-23 DIAGNOSIS — E1165 Type 2 diabetes mellitus with hyperglycemia: Secondary | ICD-10-CM

## 2013-08-23 DIAGNOSIS — F418 Other specified anxiety disorders: Secondary | ICD-10-CM

## 2013-08-23 DIAGNOSIS — IMO0001 Reserved for inherently not codable concepts without codable children: Secondary | ICD-10-CM

## 2013-08-23 LAB — HM DIABETES FOOT EXAM: HM Diabetic Foot Exam: NORMAL

## 2013-08-23 MED ORDER — CITALOPRAM HYDROBROMIDE 10 MG PO TABS: 5.0000 mg | ORAL_TABLET | Freq: Every day | ORAL | Status: DC

## 2013-08-23 NOTE — Patient Instructions (Signed)
I am glad you are feeling better  i recommend continuing the 1/2 tablet of citalopram daily for 3 more months (until end of May)  Then, if you want to discontinue it gradually, we can discuss that at your next visit  You are due for your next A1c on or after March 16th.
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
## 2013-08-23 NOTE — Progress Notes (Signed)
Pre-visit discussion using our clinic review tool. No additional management support is needed unless otherwise documented below in the visit note.  

## 2013-08-23 NOTE — Assessment & Plan Note (Addendum)
Improved with 5 mg daily dose of Citalopram .  Did not tolerate higher dose.  Continue  5 mg daily dose/.

## 2013-08-24 ENCOUNTER — Encounter: Payer: Self-pay | Admitting: Internal Medicine

## 2013-08-24 NOTE — Assessment & Plan Note (Signed)
She has been checking her sugars less often and has had no hypoglycemic events.

## 2013-08-25 ENCOUNTER — Telehealth: Payer: Self-pay

## 2013-08-25 NOTE — Telephone Encounter (Signed)
Relevant patient education assigned to patient using Emmi. ° °

## 2013-09-22 ENCOUNTER — Other Ambulatory Visit: Payer: Self-pay | Admitting: Internal Medicine

## 2013-10-25 ENCOUNTER — Ambulatory Visit: Payer: Medicare Other | Admitting: Internal Medicine

## 2013-11-05 ENCOUNTER — Ambulatory Visit: Payer: Medicare Other | Admitting: Internal Medicine

## 2013-11-09 ENCOUNTER — Ambulatory Visit (INDEPENDENT_AMBULATORY_CARE_PROVIDER_SITE_OTHER): Payer: Medicare Other | Admitting: Internal Medicine

## 2013-11-09 ENCOUNTER — Encounter: Payer: Self-pay | Admitting: Internal Medicine

## 2013-11-09 VITALS — BP 102/60 | HR 109 | Temp 97.4°F | Resp 16 | Ht 62.5 in | Wt 88.0 lb

## 2013-11-09 DIAGNOSIS — D649 Anemia, unspecified: Secondary | ICD-10-CM

## 2013-11-09 DIAGNOSIS — E119 Type 2 diabetes mellitus without complications: Secondary | ICD-10-CM

## 2013-11-09 DIAGNOSIS — R634 Abnormal weight loss: Secondary | ICD-10-CM

## 2013-11-09 DIAGNOSIS — E1165 Type 2 diabetes mellitus with hyperglycemia: Secondary | ICD-10-CM

## 2013-11-09 DIAGNOSIS — D509 Iron deficiency anemia, unspecified: Secondary | ICD-10-CM

## 2013-11-09 DIAGNOSIS — IMO0001 Reserved for inherently not codable concepts without codable children: Secondary | ICD-10-CM

## 2013-11-09 DIAGNOSIS — IMO0002 Reserved for concepts with insufficient information to code with codable children: Secondary | ICD-10-CM

## 2013-11-09 DIAGNOSIS — Z85118 Personal history of other malignant neoplasm of bronchus and lung: Secondary | ICD-10-CM

## 2013-11-09 LAB — IRON AND TIBC
%SAT: 9 % — ABNORMAL LOW (ref 20–55)
IRON: 31 ug/dL — AB (ref 42–145)
TIBC: 360 ug/dL (ref 250–470)
UIBC: 329 ug/dL (ref 125–400)

## 2013-11-09 MED ORDER — INSULIN ASPART 100 UNIT/ML FLEXPEN: 4.0000 [IU] | PEN_INJECTOR | Freq: Two times a day (BID) | SUBCUTANEOUS | Status: DC

## 2013-11-09 MED ORDER — INSULIN GLARGINE 100 UNIT/ML SOLOSTAR PEN: 8.0000 [IU] | PEN_INJECTOR | Freq: Two times a day (BID) | SUBCUTANEOUS | Status: DC
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
## 2013-11-09 NOTE — Assessment & Plan Note (Addendum)
Ct chest abd and pelvis ordered given history of small cell CA

## 2013-11-09 NOTE — Progress Notes (Signed)
Pre-visit discussion using our clinic review tool. No additional management support is needed unless otherwise documented below in the visit note.  

## 2013-11-09 NOTE — Assessment & Plan Note (Signed)
Last hgb was 9.  And iron stores were low,  She has been taking Ironsorb which has 18 mg iron daily.  Rechecking today

## 2013-11-09 NOTE — Assessment & Plan Note (Signed)
Now with recurrent hypoglycemia events.  Reduce lantus to 8 mg bid and novolog to 4 mg bid.

## 2013-11-09 NOTE — Patient Instructions (Addendum)
I suggest reducing novalog bid dosing from 5-6 units to 4 units bid and lantus to 8 units twice daily   I'm glad the celexa is helping   I am ordering a CT of the chest abdomen and pelvis to rule out cancer because of your persistent weight loss .     You can have the [facility] staff check your fasting, before lunch and before dinner  Blood sugars daily .  You can check your  Own sugar before bedtime

## 2013-11-10 LAB — COMPREHENSIVE METABOLIC PANEL
ALK PHOS: 83 U/L (ref 39–117)
ALT: 10 U/L (ref 0–35)
AST: 21 U/L (ref 0–37)
Albumin: 3.3 g/dL — ABNORMAL LOW (ref 3.5–5.2)
BILIRUBIN TOTAL: 0.5 mg/dL (ref 0.2–1.2)
BUN: 34 mg/dL — ABNORMAL HIGH (ref 6–23)
CO2: 29 mEq/L (ref 19–32)
Calcium: 9.9 mg/dL (ref 8.4–10.5)
Chloride: 100 mEq/L (ref 96–112)
Creatinine, Ser: 1 mg/dL (ref 0.4–1.2)
GFR: 58.11 mL/min — ABNORMAL LOW (ref >60.00)
Glucose, Bld: 161 mg/dL — ABNORMAL HIGH (ref 70–99)
Potassium: 5.2 mEq/L — ABNORMAL HIGH (ref 3.5–5.1)
SODIUM: 137 meq/L (ref 135–145)
TOTAL PROTEIN: 7.8 g/dL (ref 6.0–8.3)

## 2013-11-10 LAB — CBC WITH DIFFERENTIAL/PLATELET
Basophils Absolute: 0 10*3/uL (ref 0.0–0.1)
Basophils Relative: 0.1 % (ref 0.0–3.0)
EOS ABS: 0.3 10*3/uL (ref 0.0–0.7)
Eosinophils Relative: 2.4 % (ref 0.0–5.0)
HCT: 35.1 % — ABNORMAL LOW (ref 36.0–46.0)
Hemoglobin: 10.9 g/dL — ABNORMAL LOW (ref 12.0–15.0)
LYMPHS PCT: 6.4 % — AB (ref 12.0–46.0)
Lymphs Abs: 0.7 10*3/uL (ref 0.7–4.0)
MCHC: 31 g/dL (ref 30.0–36.0)
MCV: 77.4 fl — AB (ref 78.0–100.0)
Monocytes Absolute: 0 10*3/uL — ABNORMAL LOW (ref 0.1–1.0)
Monocytes Relative: 0.4 % — ABNORMAL LOW (ref 3.0–12.0)
NEUTROS PCT: 90.7 % — AB (ref 43.0–77.0)
Neutro Abs: 10.3 10*3/uL — ABNORMAL HIGH (ref 1.4–7.7)
PLATELETS: 455 10*3/uL — AB (ref 150.0–400.0)
RBC: 4.53 Mil/uL (ref 3.87–5.11)
RDW: 19.4 % — ABNORMAL HIGH (ref 11.5–15.5)
WBC: 11.3 10*3/uL — ABNORMAL HIGH (ref 4.0–10.5)

## 2013-11-10 LAB — FERRITIN: Ferritin: 11.8 ng/mL (ref 10.0–291.0)

## 2013-11-10 LAB — HEMOGLOBIN A1C: HEMOGLOBIN A1C: 7.9 % — AB (ref 4.6–6.5)

## 2013-11-11 ENCOUNTER — Encounter: Payer: Self-pay | Admitting: Internal Medicine

## 2013-11-12 ENCOUNTER — Telehealth: Payer: Self-pay | Admitting: *Deleted

## 2013-11-12 NOTE — Telephone Encounter (Signed)
Pt was notified of her CT scheduled on Tuesday. States she is really unsure whether she wants to have it done or not. States even if it finds cancer, she will not do anything about it. Advised to think about it and let us and [facility] know prior to her appt if she wants to cancel it. Pt wanted Dr. Derrel Nip to be aware of her concerns.

## 2013-11-12 NOTE — Telephone Encounter (Signed)
Pt called to let dr Derrel Nip know she cancel her ct appointment on 5/22.  She wants to get more information. She will call back to make an appointment with dr Derrel Nip

## 2013-11-20 ENCOUNTER — Encounter: Payer: Self-pay | Admitting: Internal Medicine

## 2013-12-08 NOTE — Telephone Encounter (Signed)
Mailed unread message to pt  

## 2013-12-17 ENCOUNTER — Other Ambulatory Visit: Payer: Self-pay | Admitting: Internal Medicine

## 2014-01-31 ENCOUNTER — Other Ambulatory Visit: Payer: Self-pay | Admitting: Internal Medicine

## 2014-02-02 ENCOUNTER — Telehealth: Payer: Self-pay | Admitting: Internal Medicine

## 2014-03-01 ENCOUNTER — Other Ambulatory Visit: Payer: Self-pay | Admitting: Internal Medicine

## 2014-03-01 NOTE — Telephone Encounter (Signed)
Last refill 4.1.15, last OV 5.19.15.  Please advise refill

## 2014-03-02 NOTE — Telephone Encounter (Signed)
Ok to refill,  Refill sent  

## 2014-03-04 ENCOUNTER — Telehealth: Payer: Self-pay | Admitting: *Deleted

## 2014-03-04 NOTE — Telephone Encounter (Signed)
Pt left VM, returning call. Called back, no answer

## 2014-03-04 NOTE — Telephone Encounter (Signed)
Pt states her Citalopram is causing itching, would like to taper off and needs directions in how to do that. Has appt with Dr. Derrel Nip 03/08/14 also to discuss as well

## 2014-03-04 NOTE — Telephone Encounter (Signed)
Left message for pt to return my call.

## 2014-03-04 NOTE — Telephone Encounter (Signed)
If she is taking 1/2 tablet daily (5 mg daily),  She can reduce it to every other day for 10 days,  Then stop

## 2014-03-07 NOTE — Telephone Encounter (Signed)
Pt notified and verbalized understanding.

## 2014-03-08 ENCOUNTER — Ambulatory Visit (INDEPENDENT_AMBULATORY_CARE_PROVIDER_SITE_OTHER): Payer: Medicare Other | Admitting: Internal Medicine

## 2014-03-08 ENCOUNTER — Encounter: Payer: Self-pay | Admitting: Internal Medicine

## 2014-03-08 VITALS — BP 138/88 | HR 112 | Temp 97.6°F | Resp 14 | Ht 62.5 in | Wt 91.5 lb

## 2014-03-08 DIAGNOSIS — IMO0001 Reserved for inherently not codable concepts without codable children: Secondary | ICD-10-CM

## 2014-03-08 DIAGNOSIS — D509 Iron deficiency anemia, unspecified: Secondary | ICD-10-CM

## 2014-03-08 DIAGNOSIS — D649 Anemia, unspecified: Secondary | ICD-10-CM

## 2014-03-08 DIAGNOSIS — IMO0002 Reserved for concepts with insufficient information to code with codable children: Secondary | ICD-10-CM

## 2014-03-08 DIAGNOSIS — E1151 Type 2 diabetes mellitus with diabetic peripheral angiopathy without gangrene: Secondary | ICD-10-CM | POA: Insufficient documentation

## 2014-03-08 DIAGNOSIS — R0989 Other specified symptoms and signs involving the circulatory and respiratory systems: Secondary | ICD-10-CM

## 2014-03-08 DIAGNOSIS — F418 Other specified anxiety disorders: Secondary | ICD-10-CM

## 2014-03-08 DIAGNOSIS — E1165 Type 2 diabetes mellitus with hyperglycemia: Secondary | ICD-10-CM

## 2014-03-08 DIAGNOSIS — R634 Abnormal weight loss: Secondary | ICD-10-CM

## 2014-03-08 DIAGNOSIS — Z23 Encounter for immunization: Secondary | ICD-10-CM

## 2014-03-08 DIAGNOSIS — F341 Dysthymic disorder: Secondary | ICD-10-CM

## 2014-03-08 MED ORDER — TETANUS-DIPHTH-ACELL PERTUSSIS 5-2.5-18.5 LF-MCG/0.5 IM SUSP: 0.5000 mL | Freq: Once | INTRAMUSCULAR | Status: DC

## 2014-03-08 NOTE — Progress Notes (Signed)
Pre-visit discussion using our clinic review tool. No additional management support is needed unless otherwise documented below in the visit note.  
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
## 2014-03-08 NOTE — Patient Instructions (Addendum)
You are doing well!  Yo have gained 3 lbs since your last visit  I want you to consider omitting the bedtime dose of novolog if you continue to have fasting sugars under 80  I have give you a prescription for the TDaP vaccine to get at your local pharmacy  Please get yoru eyes axamined at [facility] (we''ll call for you and get an afternoon appt with Dr Dawna Part)   I am also referring you the vascular doctor to check the circulation in your feet

## 2014-03-09 LAB — HEMOGLOBIN A1C: Hgb A1c MFr Bld: 8.6 % — ABNORMAL HIGH (ref 4.6–6.5)

## 2014-03-09 LAB — VARICELLA ZOSTER ABS, IGG/IGM: Varicella zoster IgG: 3184 index (ref >165)

## 2014-03-10 ENCOUNTER — Encounter: Payer: Self-pay | Admitting: Internal Medicine

## 2014-03-10 ENCOUNTER — Telehealth: Payer: Self-pay | Admitting: *Deleted

## 2014-03-10 LAB — COMPREHENSIVE METABOLIC PANEL
ALT: 17 U/L (ref 0–35)
AST: 29 U/L (ref 0–37)
Albumin: 3.9 g/dL (ref 3.5–5.2)
Alkaline Phosphatase: 79 U/L (ref 39–117)
BILIRUBIN TOTAL: 0.4 mg/dL (ref 0.2–1.2)
BUN: 33 mg/dL — ABNORMAL HIGH (ref 6–23)
CALCIUM: 9.8 mg/dL (ref 8.4–10.5)
CHLORIDE: 105 meq/L (ref 96–112)
CO2: 24 meq/L (ref 19–32)
CREATININE: 1 mg/dL (ref 0.4–1.2)
GFR: 55.42 mL/min — AB (ref >60.00)
GLUCOSE: 128 mg/dL — AB (ref 70–99)
Potassium: 5.1 mEq/L (ref 3.5–5.1)
Sodium: 140 mEq/L (ref 135–145)
Total Protein: 8.6 g/dL — ABNORMAL HIGH (ref 6.0–8.3)

## 2014-03-10 LAB — LIPID PANEL
CHOL/HDL RATIO: 2
Cholesterol: 188 mg/dL (ref 0–200)
HDL: 90.4 mg/dL (ref >39.00)
LDL Cholesterol: 90 mg/dL (ref 0–99)
NONHDL: 97.6
TRIGLYCERIDES: 39 mg/dL (ref 0.0–149.0)
VLDL: 7.8 mg/dL (ref 0.0–40.0)

## 2014-03-10 LAB — CBC WITH DIFFERENTIAL/PLATELET
Basophils Absolute: 0 10*3/uL (ref 0.0–0.1)
Basophils Relative: 0.4 % (ref 0.0–3.0)
EOS PCT: 3.3 % (ref 0.0–5.0)
Eosinophils Absolute: 0.4 10*3/uL (ref 0.0–0.7)
HCT: 33.4 % — ABNORMAL LOW (ref 36.0–46.0)
HEMOGLOBIN: 10.1 g/dL — AB (ref 12.0–15.0)
Lymphocytes Relative: 7.5 % — ABNORMAL LOW (ref 12.0–46.0)
Lymphs Abs: 0.8 10*3/uL (ref 0.7–4.0)
MCHC: 30.2 g/dL (ref 30.0–36.0)
MCV: 82.8 fl (ref 78.0–100.0)
MONOS PCT: 4.3 % (ref 3.0–12.0)
Monocytes Absolute: 0.5 10*3/uL (ref 0.1–1.0)
NEUTROS ABS: 9.2 10*3/uL — AB (ref 1.4–7.7)
Neutrophils Relative %: 84.5 % — ABNORMAL HIGH (ref 43.0–77.0)
Platelets: 287 10*3/uL (ref 150.0–400.0)
RBC: 4.03 Mil/uL (ref 3.87–5.11)
RDW: 19.1 % — ABNORMAL HIGH (ref 11.5–15.5)
WBC: 10.8 10*3/uL — AB (ref 4.0–10.5)

## 2014-03-10 LAB — HM DIABETES FOOT EXAM: HM DIABETIC FOOT EXAM: ABNORMAL

## 2014-03-10 NOTE — Assessment & Plan Note (Signed)
referral to Vascular for rule out PAD

## 2014-03-10 NOTE — Assessment & Plan Note (Signed)
Stopping citalopram today due to persistent itching and patient's improved mood and affect

## 2014-03-10 NOTE — Telephone Encounter (Signed)
Yes, i will .  Please put one in my red folder

## 2014-03-10 NOTE — Assessment & Plan Note (Addendum)
Ct chest abd and pelvis was ordered in May given her history of small cell lung  CA, but she has declined.  She has gained 3 lbs with improved mood and attention to diet.

## 2014-03-10 NOTE — Telephone Encounter (Signed)
Pt called requesting a handicap placard.  States she failed to request it during her OV.  Please advise

## 2014-03-10 NOTE — Assessment & Plan Note (Signed)
Uncontrolled despite frequent hypoglycemic events.  Advised her to suspend the bedtime novolog and bring BS log in after two weeks.  Foot exam is abnormal,  And she is being referred to Vascualr Surgery for decreased pulses on today's exam. She is reminded to have her annual eye exam and has been continually unable to provide urine for assessement of proteinuria.  Lab Results  Component Value Date   HGBA1C 8.6* 03/08/2014   No results found for this basename: MICROALBUR, [id number]

## 2014-03-11 NOTE — Telephone Encounter (Signed)
Message left on pts VM advising form ready for pick up.

## 2014-03-17 ENCOUNTER — Other Ambulatory Visit: Payer: Medicare Other | Admitting: *Deleted

## 2014-03-17 DIAGNOSIS — E1165 Type 2 diabetes mellitus with hyperglycemia: Principal | ICD-10-CM

## 2014-03-17 DIAGNOSIS — IMO0001 Reserved for inherently not codable concepts without codable children: Secondary | ICD-10-CM

## 2014-03-17 LAB — MICROALBUMIN / CREATININE URINE RATIO
CREATININE, U: 89.6 mg/dL
MICROALB/CREAT RATIO: 14.6 mg/g (ref 0.0–30.0)
Microalb, Ur: 13.1 mg/dL — ABNORMAL HIGH (ref 0.0–1.9)

## 2014-03-19 ENCOUNTER — Encounter: Payer: Self-pay | Admitting: Internal Medicine

## 2014-03-29 ENCOUNTER — Telehealth: Payer: Self-pay

## 2014-03-29 NOTE — Telephone Encounter (Signed)
Scheduled pt for 2:45.  Left message on pts VM.  Please call pt this morning to confirm she received VM.

## 2014-03-29 NOTE — Telephone Encounter (Signed)
2:45 tomorrow work in

## 2014-03-29 NOTE — Telephone Encounter (Signed)
The patient is hoping to be seen tomorrow for her leg.  She states she saw a nurse at the nursing home today (03/29/14) and they recommend her being seen as soon as possible.  She was offered appointments with different providers, but is hoping to be worked into Dr.Tullo's schedule. Is this something that should be done?   pts callback - [phone]

## 2014-03-30 ENCOUNTER — Ambulatory Visit (INDEPENDENT_AMBULATORY_CARE_PROVIDER_SITE_OTHER): Payer: Medicare Other | Admitting: Internal Medicine

## 2014-03-30 ENCOUNTER — Encounter: Payer: Self-pay | Admitting: Internal Medicine

## 2014-03-30 VITALS — BP 108/60 | HR 103 | Temp 97.4°F | Resp 14 | Ht 62.5 in | Wt 92.8 lb

## 2014-03-30 DIAGNOSIS — E11621 Type 2 diabetes mellitus with foot ulcer: Secondary | ICD-10-CM

## 2014-03-30 DIAGNOSIS — L03116 Cellulitis of left lower limb: Secondary | ICD-10-CM

## 2014-03-30 DIAGNOSIS — L97509 Non-pressure chronic ulcer of other part of unspecified foot with unspecified severity: Secondary | ICD-10-CM

## 2014-03-30 DIAGNOSIS — L97529 Non-pressure chronic ulcer of other part of left foot with unspecified severity: Secondary | ICD-10-CM

## 2014-03-30 DIAGNOSIS — R0989 Other specified symptoms and signs involving the circulatory and respiratory systems: Secondary | ICD-10-CM

## 2014-03-30 MED ORDER — CEPHALEXIN 500 MG PO CAPS: 500.0000 mg | ORAL_CAPSULE | Freq: Four times a day (QID) | ORAL | Status: DC

## 2014-03-30 MED ORDER — CULTURELLE DIGESTIVE HEALTH PO CAPS: 1.0000 | ORAL_CAPSULE | Freq: Every day | ORAL | Status: DC

## 2014-03-30 NOTE — Progress Notes (Signed)
Pre-visit discussion using our clinic review tool. No additional management support is needed unless otherwise documented below in the visit note.  

## 2014-03-30 NOTE — Patient Instructions (Addendum)
You have a diabetic foot ulcer that appears to be infected /with cellulitis  i am prescribing cephalexin , an antibiotic to take 4 times daily along with a daily probiotic to prevent diarrhea   I am also referring you to the [facility] to manage this wound.   Please  have tha RN check your leg tomorrow to make sure the redness has not spread
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
## 2014-03-30 NOTE — Telephone Encounter (Signed)
Called pt, she is aware of apt information

## 2014-03-30 NOTE — Assessment & Plan Note (Signed)
Stage 3-4,  With erythema concerning for cellulitis .  Keflex,  Leg elevation,  Wound center referral,  vascualr referral is scheduled for Oct 22 .   RN at [facility] to check leg tomorrow , they have been advsed  to send her to ER for admission and treatment with IV antibiotics

## 2014-03-30 NOTE — Assessment & Plan Note (Signed)
Vascular evaluation set up for Oct 22nd.

## 2014-04-07 ENCOUNTER — Encounter: Payer: Self-pay | Admitting: Surgery

## 2014-04-10 ENCOUNTER — Encounter: Payer: Self-pay | Admitting: Internal Medicine

## 2014-04-24 ENCOUNTER — Encounter: Payer: Self-pay | Admitting: Surgery

## 2014-04-27 ENCOUNTER — Other Ambulatory Visit: Payer: Self-pay | Admitting: *Deleted

## 2014-04-27 MED ORDER — INSULIN PEN NEEDLE 31G X 8 MM MISC: Status: DC

## 2014-05-06 ENCOUNTER — Encounter: Payer: Self-pay | Admitting: Internal Medicine

## 2014-05-23 LAB — WOUND AEROBIC CULTURE

## 2014-05-24 ENCOUNTER — Encounter: Payer: Self-pay | Admitting: Surgery

## 2014-06-06 ENCOUNTER — Telehealth: Payer: Self-pay | Admitting: Internal Medicine

## 2014-06-06 NOTE — Telephone Encounter (Signed)
No answer patient has no voicemail.

## 2014-06-06 NOTE — Telephone Encounter (Signed)
I received an request for a bioback lumbar orthosis ,  Did she request this? I do not have a diagnosis in her chart to do along with its use.

## 2014-06-07 NOTE — Telephone Encounter (Signed)
Have not been able to reach patient by phone cannot leave message no voicemail.

## 2014-06-09 NOTE — Telephone Encounter (Signed)
Note closed until pt returns call

## 2014-06-22 ENCOUNTER — Encounter: Payer: Self-pay | Admitting: *Deleted

## 2014-06-24 ENCOUNTER — Encounter: Payer: Self-pay | Admitting: Surgery

## 2014-07-25 ENCOUNTER — Encounter: Payer: Self-pay | Admitting: Surgery

## 2014-08-23 ENCOUNTER — Encounter
Admit: 2014-08-23 | Disposition: A | Discharge status: Home / Self Care | Payer: Self-pay | Attending: Surgery | Admitting: Surgery

## 2014-09-08 ENCOUNTER — Other Ambulatory Visit: Payer: Self-pay | Admitting: Internal Medicine

## 2014-09-22 ENCOUNTER — Other Ambulatory Visit: Payer: Self-pay | Admitting: Internal Medicine

## 2014-09-23 ENCOUNTER — Encounter
Admit: 2014-09-23 | Disposition: A | Discharge status: Home / Self Care | Payer: Self-pay | Attending: Surgery | Admitting: Surgery

## 2014-10-17 LAB — SURGICAL PATHOLOGY

## 2014-10-26 ENCOUNTER — Ambulatory Visit: Payer: Self-pay | Admitting: Surgery

## 2014-10-26 ENCOUNTER — Encounter: Payer: Medicare Other | Attending: Surgery | Admitting: Surgery

## 2014-10-26 DIAGNOSIS — I83223 Varicose veins of left lower extremity with both ulcer of ankle and inflammation: Secondary | ICD-10-CM | POA: Insufficient documentation

## 2014-10-26 DIAGNOSIS — E1140 Type 2 diabetes mellitus with diabetic neuropathy, unspecified: Secondary | ICD-10-CM | POA: Diagnosis not present

## 2014-10-26 DIAGNOSIS — Z85118 Personal history of other malignant neoplasm of bronchus and lung: Secondary | ICD-10-CM | POA: Insufficient documentation

## 2014-10-26 DIAGNOSIS — I872 Venous insufficiency (chronic) (peripheral): Secondary | ICD-10-CM | POA: Insufficient documentation

## 2014-10-26 DIAGNOSIS — E11621 Type 2 diabetes mellitus with foot ulcer: Secondary | ICD-10-CM | POA: Diagnosis not present

## 2014-10-26 DIAGNOSIS — L97529 Non-pressure chronic ulcer of other part of left foot with unspecified severity: Secondary | ICD-10-CM | POA: Diagnosis not present

## 2014-10-26 NOTE — Progress Notes (Addendum)
Penny Clements, Penny Clements ([id number]) Visit Report for 10/26/2014 Chief Complaint Document Details Patient Name: Penny Clements, Penny Clements Date of Service: 10/26/2014 2:45 PM Medical Record Number: [id number] Patient Account Number: [id number] Date of Birth/Sex: [birth year] (81 y.o. Female) Treating RN: Montey Hora Primary Care Physician: Deborra Medina Other Clinician: Referring Physician: Deborra Medina Treating Physician/Extender: BURNS, Charlean Sanfilippo in Treatment: 0 Information Obtained from: Patient Chief Complaint Recurrent left lateral foot ulcers. Electronic Signature(s) Signed: 10/26/2014 4:29:30 PM By: Loletha Grayer MD Entered By: Loletha Grayer on 10/26/2014 15:56:59 Penny Clements ([id number]) -------------------------------------------------------------------------------- Cellular or Tissue Based Product Details Patient Name: Penny Clements Date of Service: 10/26/2014 2:45 PM Medical Record Number: [id number] Patient Account Number: [id number] Date of Birth/Sex: [birth year] (81 y.o. Female) Treating RN: Montey Hora Primary Care Physician: Deborra Medina Other Clinician: Referring Physician: Deborra Medina Treating Physician/Extender: BURNS, Charlean Sanfilippo in Treatment: 0 Cellular or Tissue Based Wound #7 Left,Dorsal Foot Product Type Applied to: Performed By: Physician BURNS, Thayer Jew, Cellular or Tissue Based Epifix Product Type: Time-Out Taken: Yes Location: genitalia / hands / feet / multiple digits Wound Size (sq cm): 1.82 Product Size (sq cm): 2 Waste Size (sq cm): 0 Amount of Product Applied (sq cm): 2 Lot #: [id number] Order #: [id number] Expiration Date: 07/26/2019 Secured: Yes Secured With: Steri-Strips Dressing Applied: Yes Primary Dressing: adaptic Procedural Pain: 0 Post Procedural Pain: 0 Response to Treatment: Procedure was tolerated well Electronic Signature(s) Signed: 10/28/2014 11:00:56 AM By: Montey Hora Previous Signature: 10/26/2014 4:29:30  PM Version By: Loletha Grayer MD Entered By: Montey Hora on 10/28/2014 11:00:55 Penny Clements ([id number]) -------------------------------------------------------------------------------- HPI Details Patient Name: Penny Clements Date of Service: 10/26/2014 2:45 PM Medical Record Number: [id number] Patient Account Number: [id number] Date of Birth/Sex: 08-Apr-1928 (81 y.o. Female) Treating RN: Montey Hora Primary Care Physician: Deborra Medina Other Clinician: Referring Physician: Deborra Medina Treating Physician/Extender: BURNS, Charlean Sanfilippo in Treatment: 0 History of Present Illness HPI Description: Very pleasant 81 year old with a past medical history significant for type 2 diabetes and lung cancer (status post chemoradiation). She has had a recurrent left diabetic foot ulcer for almost 10 years, which has healed in the past with compression therapy. This recently recurred in October 2015. No history of trauma. No significant pain. No symptoms to indicate ischemic rest pain or claudication. ABI 1.11. Ambulating per her baseline with a walker. Culture on 05/18/2014 grew methicillin sensitive staph aureus and staph lugdunensis, both sensitive to tetracycline. She was started on doxycycline but developed severe itching. She does not want anymore antibiotics. Punch biopsy on 06/29/2014 showed no evidence for malignancy. Consistent with stasis dermatitis. Underwent normal CVI ultrasound at [facility] in March 2016. Original ulcer has healed. She developed a second adjacent ulceration dorsally. Treating with regular debridements and 4 layer compression. Improved with epifix x7 (initially applied on 07/06/2014). She returns to clinic for follow-up and is without new complaints. No trauma. No significant pain. No fever or chills. No significant drainage. Electronic Signature(s) Signed: 10/26/2014 4:29:30 PM By: Loletha Grayer MD Entered By: Loletha Grayer  on 10/26/2014 15:57:08 Penny Clements ([id number]) -------------------------------------------------------------------------------- Physical Exam Details Patient Name: Penny Clements Date of Service: 10/26/2014 2:45 PM Medical Record Number: [id number] Patient Account Number: [id number] Date of Birth/Sex: 1928-01-20 (81 y.o. Female) Treating RN: Montey Hora Primary Care Physician: Deborra Medina Other Clinician: Referring Physician: Deborra Medina Treating Physician/Extender: BURNS, Charlean Sanfilippo in Treatment: 0 Constitutional . Pulse regular. Respirations normal and unlabored. Afebrile. . Notes Original left  lateral foot ulceration is completely re-epithelialized. No drainage. No infection. More recent left dorsal foot ulceration is improved. No active infection. Edema well controlled. Palpable DP. Electronic Signature(s) Signed: 10/26/2014 4:29:30 PM By: Loletha Grayer MD Entered By: Loletha Grayer on 10/26/2014 15:57:34 Penny Clements ([id number]) -------------------------------------------------------------------------------- Physician Orders Details Patient Name: Penny Clements Date of Service: 10/26/2014 2:45 PM Medical Record Number: [id number] Patient Account Number: [id number] Date of Birth/Sex: [birth year] (81 y.o. 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 o No tub bath. Wound #8  Left,Distal,Dorsal Foot o May shower with protection. o No tub bath. Primary Wound Dressing Wound #7 Left,Dorsal Foot o Other: - epifix applied by MD today Wound #8 Left,Distal,Dorsal Foot o Other: - mepitel one Secondary Dressing Wound #7 Left,Dorsal Foot o Dry Gauze Wound #8 Left,Distal,Dorsal Foot o Foam Dressing Change Frequency Wound #7 Left,Dorsal Foot o Change dressing every week Wound #8 Left,Distal,Dorsal Foot o Change dressing every week Penny Clements, Penny T. ([id number]) Follow-up Appointments Wound #7 Left,Dorsal Foot o Return Appointment in 1 week. Wound #8 Left,Distal,Dorsal Foot o Return Appointment in 1 week. Edema Control Wound #7 Left,Dorsal Foot o 4-Layer Compression System - Left Lower Extremity Wound #8 Left,Distal,Dorsal Foot o 4-Layer Compression System - Left Lower Extremity Electronic Signature(s) Signed: 10/26/2014 4:30:20 PM By: Montey Hora Signed: 10/26/2014 4:31:31 PM By: Loletha Grayer MD Entered By: Montey Hora on 10/26/2014 16:30:19 Penny Clements ([id number]) -------------------------------------------------------------------------------- Problem List Details Patient Name: Penny Clements Date of Service: 10/26/2014 2:45 PM Medical Record Number: [id number] Patient Account Number: [id number] Date of Birth/Sex: 23-Aug-1927 (81 y.o. Female) Treating RN: Montey Hora Primary Care Physician: Deborra Medina Other Clinician: Referring Physician: Deborra Medina Treating Physician/Extender: BURNS, Charlean Sanfilippo in Treatment: 0 Active Problems ICD-10 Encounter Code Description Active Date Diagnosis E11.621 Type 2 diabetes mellitus with foot ulcer 04/07/2014 Yes E11.40 Type 2 diabetes mellitus with diabetic neuropathy, 04/27/2014 Yes unspecified I83.223 Varicose veins of left lower extremity with both ulcer of 07/06/2014 Yes ankle and inflammation Inactive Problems ICD-10 Code Description Active Date Inactive  Date L03.116 Cellulitis of left lower limb 05/18/2014 05/25/2014 Resolved Problems Electronic Signature(s) Signed: 10/26/2014 4:29:30 PM By: Loletha Grayer MD Entered By: Loletha Grayer on 10/26/2014 15:55:29 Penny Clements ([id number]) -------------------------------------------------------------------------------- Progress Note Details Patient Name: Penny Clements Date of Service: 10/26/2014 2:45 PM Medical Record Number: [id number] Patient Account Number: [id number] Date of Birth/Sex: [birth year] (81 y.o. Female) Treating RN: Montey Hora Primary Care Physician: Deborra Medina Other Clinician: Referring Physician: Deborra Medina Treating Physician/Extender: Suella Grove in Treatment: 0 Subjective Chief Complaint Information obtained from Patient Recurrent left lateral foot ulcers. History of Present Illness (HPI) Very pleasant 81 year old with a past medical history significant for type 2 diabetes and lung cancer (status post chemoradiation). She has had a recurrent left diabetic foot ulcer for almost 10 years, which has healed in the past with compression therapy. This recently recurred in October 2015. No history of trauma. No significant pain. No symptoms to indicate ischemic rest pain or claudication. ABI 1.11. Ambulating per her  baseline with a walker. Culture on 05/18/2014 grew methicillin sensitive staph aureus and staph lugdunensis, both sensitive to tetracycline. She was started on doxycycline but developed severe itching. She does not want anymore antibiotics. Punch biopsy on 06/29/2014 showed no evidence for malignancy. Consistent with stasis dermatitis. Underwent normal CVI ultrasound at [facility] in March 2016. Original ulcer has healed. She developed a second adjacent ulceration dorsally. Treating with regular debridements and 4 layer compression. Improved with epifix x7 (initially applied on 07/06/2014). She returns to clinic for follow-up and  is without new complaints. No trauma. No significant pain. No fever or chills. No significant drainage. Objective Constitutional Pulse regular. Respirations normal and unlabored. Afebrile. Vitals Time Taken: 3:05 PM, Height: 63 in, Weight: 92 lbs, BMI: 16.3, Temperature: 97.6 F, Pulse: 103 bpm, Respiratory Rate: 20 breaths/min, Blood Pressure: 131/71 mmHg. Penny Clements, Penny Clements Kitchen ([id number]) General Notes: Original left lateral foot ulceration is completely re-epithelialized. No drainage. No infection. More recent left dorsal foot ulceration is improved. No active infection. Edema well controlled. Palpable DP. Integumentary (Hair, Skin) Wound #7 status is Open. Original cause of wound was Gradually Appeared. The wound is located on the Left,Dorsal Foot. The wound measures 2.6cm length x 0.7cm width x 0.1cm depth; 1.429cm^2 area and 0.143cm^3 volume. The wound is limited to skin breakdown. There is no tunneling or undermining noted. There is a small amount of serous drainage noted. The wound margin is distinct with the outline attached to the wound base. There is medium (34-66%) pink, pale granulation within the wound bed. There is a medium (34-66%) amount of necrotic tissue within the wound bed including Adherent Slough. The periwound skin appearance exhibited: Dry/Scaly, Moist, Erythema. The periwound skin appearance did not exhibit: Callus, Crepitus, Excoriation, Fluctuance, Friable, Induration, Localized Edema, Rash, Scarring, Maceration, Atrophie Blanche, Cyanosis, Ecchymosis, Hemosiderin Staining, Mottled, Pallor, Rubor. The surrounding wound skin color is noted with erythema. Periwound temperature was noted as No Abnormality. Wound #8 status is Open. Original cause of wound was Gradually Appeared. The wound is located on the Left,Distal,Dorsal Foot. The wound measures 2.2cm length x 1.5cm width x 0.1cm depth; 2.592cm^2 area and 0.259cm^3 volume. The wound is limited to skin breakdown.  There is no tunneling or undermining noted. There is a medium amount of serous drainage noted. The wound margin is flat and intact. There is large (67-100%) pink granulation within the wound bed. There is no necrotic tissue within the wound bed. The periwound skin appearance did not exhibit: Callus, Crepitus, Excoriation, Fluctuance, Friable, Induration, Localized Edema, Rash, Scarring, Dry/Scaly, Maceration, Moist, Atrophie Blanche, Cyanosis, Ecchymosis, Hemosiderin Staining, Mottled, Pallor, Rubor, Erythema. 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 Penny Clements, Penny Clements ([id number]) Procedures Wound #7 Wound #7 is an Arterial Insufficiency Ulcer located on the Left,Dorsal Foot. A skin graft procedure using a bioengineered skin substitute/cellular or tissue based product was performed by BURNS, Ahnesty Finfrock. Epifix was applied and secured with Steri-Strips. 2 sq cm of product was utilized and 0 sq cm was wasted. Post Application, adaptic was applied. A Time Out was conducted prior to the start of the procedure. The procedure  was tolerated well with a pain level of 0 throughout and a pain level of 0 following the procedure. Plan Wound Cleansing: Wound #7 Left,Dorsal Foot: May shower with protection. No tub bath. Wound #8 Left,Distal,Dorsal Foot: May shower with protection. No tub bath. Primary Wound Dressing: Wound #7 Left,Dorsal Foot: Other: - epifix applied by MD today Wound #8 Left,Distal,Dorsal Foot: Other: - mepitel  one Secondary Dressing: Wound #7 Left,Dorsal Foot: Dry Gauze Wound #8 Left,Distal,Dorsal Foot: Foam Dressing Change Frequency: Wound #7 Left,Dorsal Foot: Change dressing every week Wound #8 Left,Distal,Dorsal Foot: Change dressing every week Follow-up Appointments: Wound #7 Left,Dorsal Foot: Return Appointment in 1 week. Wound #8 Left,Distal,Dorsal Foot: Return Appointment in 1 week. Edema Control: Wound #7 Left,Dorsal Foot: 4-Layer Compression System - Left Lower Extremity Wound #8 Left,Distal,Dorsal Foot: 4-Layer Compression System - Left Lower Extremity Penny Clements, Penny Clements ([id number]) Follow-Up Appointments: A Patient Clinical Summary of Care was provided to [facility] Epifix #8 applied. 4-layer compression. Electronic Signature(s) Signed: 02/01/2015 10:13:26 AM By: Lorine Bears RCP, RRT, CHT Previous Signature: 10/26/2014 4:29:30 PM Version By: Loletha Grayer MD Entered By: Lorine Bears on 02/01/2015 10:13:26 Penny Clements ([id number]) -------------------------------------------------------------------------------- SuperBill Details Patient Name: Penny Clements Date of Service: 10/26/2014 Medical Record Number: [id number] Patient Account Number: [id number] Date of Birth/Sex: Jun 05, 1928 (81 y.o. 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 Facility Procedures CPT4 Code: [id number] Description: (Facility Use Only) B0488 o Epifix o Per 1 SQ CM Modifier: Quantity: 2 CPT4 Code: 89169450 Description: 38882 - SKIN SUB GRAFT FACE/NK/HF/G ICD-10  Description Diagnosis E11.621 Type 2 diabetes mellitus with foot ulcer Modifier: Quantity: 1 Physician Procedures CPT4 Code: 8003491 Description: 79150 - WC PHYS SKIN SUB GRAFT FACE/NK/HF/G ICD-10 Description Diagnosis E11.621 Type 2 diabetes mellitus with foot ulcer Modifier: Quantity: 1 Electronic Signature(s) Signed: 10/26/2014 4:29:30 PM By: Loletha Grayer MD Entered By: Loletha Grayer on 10/26/2014 15:58:25

## 2014-10-26 NOTE — Progress Notes (Signed)
RINOA, GARRAMONE ([id number]) Visit Report for 10/26/2014 Arrival Information Details Patient Name: Penny Clements, Penny Clements Date of Service: 10/26/2014 2:45 PM Medical Record Number: [id number] Patient Account Number: [id number] Date of Birth/Sex: 1927-08-30 (81 y.o. Female) Treating RN: Montey Hora Primary Care Physician: Deborra Medina Other Clinician: Referring Physician: Deborra Medina Treating Physician/Extender: BURNS, Charlean Sanfilippo in Treatment: 28 Visit Information History Since Last Visit Added or deleted any medications: No Patient Arrived: Walker Any new allergies or adverse reactions: No Arrival Time: 15:02 Had a fall or experienced change in No Accompanied By: self activities of daily living that may affect Transfer Assistance: None risk of falls: Patient Identification Verified: Yes Signs or symptoms of abuse/neglect since last No Secondary Verification Process Completed: Yes visito Patient Requires Transmission-Based No Hospitalized since last visit: No Precautions: Pain Present Now: No Patient Has Alerts: Yes Electronic Signature(s) Signed: 10/26/2014 4:34:55 PM By: Montey Hora Entered By: Montey Hora on 10/26/2014 15:02:48 Penny Clements ([id number]) -------------------------------------------------------------------------------- Encounter Discharge Information Details Patient Name: Penny Clements Date of Service: 10/26/2014 2:45 PM Medical Record Number: [id number] Patient Account Number: [id number] Date of Birth/Sex: 1928/06/04 (81 y.o. Female) Treating RN: Montey Hora Primary Care Physician: Deborra Medina Other Clinician: Referring Physician: Deborra Medina Treating Physician/Extender: BURNS, Charlean Sanfilippo in Treatment: 40 Encounter Discharge Information Items Schedule Follow-up Appointment: No Medication Reconciliation completed No and provided to Patient/Care [name]: Provided on Clinical Summary of Care: 10/26/2014 Form Type  Recipient Paper Patient [place] [facility] Signature(s) Signed: 10/26/2014 4:19:03 PM By: Ruthine Dose Previous Signature: 10/26/2014 4:18:41 PM Version By: Ruthine Dose Entered By: Ruthine Dose on 10/26/2014 16:19:03 Penny Clements ([id number]) -------------------------------------------------------------------------------- Lower Extremity Assessment Details Patient Name: Penny Clements Date of Service: 10/26/2014 2:45 PM Medical Record Number: [id number] Patient Account Number: [id number] Date of Birth/Sex: 12-Sep-1927 (81 y.o. Female) Treating RN: Montey Hora Primary Care Physician: Deborra Medina Other Clinician: Referring Physician: Deborra Medina Treating Physician/Extender: BURNS, Charlean Sanfilippo in Treatment: 28 Edema Assessment Assessed: [Left: No] [Right: No] E[Left: dema] [Right: :] Calf Left: Right: Point of Measurement: 33 cm From Medial Instep 26.6 cm cm Ankle Left: Right: Point of Measurement: 10 cm From Medial Instep 19.3 cm cm Vascular Assessment Pulses: Posterior Tibial Palpable: [Left:Yes] Dorsalis Pedis Palpable: [Left:Yes] Extremity colors, hair growth, and conditions: Extremity Color: [Left:Hyperpigmented] Hair Growth on Extremity: [Left:No] Temperature of Extremity: [Left:Warm] Capillary Refill: [Left:< 3 seconds] Toe Nail Assessment Left: Right: Thick: Yes Discolored: Yes Deformed: Yes Improper Length and Hygiene: No Electronic Signature(s) Signed: 10/26/2014 4:34:55 PM By: Montey Hora Entered By: Montey Hora on 10/26/2014 15:08:13 Penny Clements ([name] ([id number]) -------------------------------------------------------------------------------- Multi Wound Chart Details Patient Name: Penny Clements Date of Service: 10/26/2014 2:45 PM Medical Record Number: [id number] Patient Account Number: [id number] Date of Birth/Sex: [birth year] (81 y.o. 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 To be determined] [N/A:N/A] Comorbid History: [7:Cataracts, Anemia, Type I Cataracts, Anemia, Type I N/A Diabetes, Type II Diabetes, Neuropathy] [8:Diabetes, Type II Diabetes, Neuropathy] Date Acquired: [7:09/20/2014] [8:10/26/2014] [N/A:N/A] Weeks of Treatment: [7:5] [8:0] [N/A:N/A] Wound Status: [7:Open] [8:Open] [N/A:N/A] Measurements L x W x D  2.6x0.7x0.1 [8:2.2x1.5x0.1] [N/A:N/A] (cm) Area (cm) : [7:1.429] [8:2.592] [N/A:N/A] Volume (cm) : [7:0.143] [8:0.259] [N/A:N/A] % Reduction in Area: [7:-203.40%] [8:0.00%] [N/A:N/A] % Reduction in Volume: -52.10% [8:0.00%] [N/A:N/A] Classification: [7:Full Thickness Without Exposed Support Structures] [8:Partial Thickness] [N/A:N/A] HBO Classification: [7:Grade 1] [8:Grade 1] [N/A:N/A] Exudate Amount: [7:Small] [8:Medium] [N/A:N/A] Exudate Type: [7:Serous] [8:Serous] [N/A:N/A] Exudate Color: [7:amber] [8:amber] [N/A:N/A] Wound Margin: [7:Distinct, outline attached Flat and Intact] [N/A:N/A] Granulation Amount: [7:Medium (34-66%)] [8:Large (67-100%)] [N/A:N/A] Granulation Quality: [7:Pink, Pale] [8:Pink] [N/A:N/A] Necrotic Amount: [7:Medium (34-66%)] [8:None Present (0%)] [N/A:N/A] Exposed Structures: [7:Fascia: No Fat: No Tendon: No] [8:Fascia: No Fat: No Tendon: No] [N/A:N/A] Muscle: No Muscle: No Joint: No Joint: No Bone: No Bone: No Limited to Skin Limited to Skin Breakdown  Breakdown Epithelialization: Small (1-33%) Small (1-33%) N/A Periwound Skin Texture: Edema: No Edema: No N/A Excoriation: No Excoriation: No Induration: No Induration: No Callus: No Callus: No Crepitus: No Crepitus: No Fluctuance: No Fluctuance: No Friable: No Friable: No Rash: No Rash: No Scarring: No Scarring: No Periwound Skin Moist: Yes Maceration: No N/A Moisture: Dry/Scaly: Yes Moist: No Maceration: No Dry/Scaly: No Periwound Skin Color: Erythema: Yes Atrophie Blanche: No N/A Atrophie Blanche: No Cyanosis: No Cyanosis: No Ecchymosis: No Ecchymosis: No Erythema: No Hemosiderin Staining: No Hemosiderin Staining: No Mottled: No Mottled: No Pallor: No Pallor: No Rubor: No Rubor: No Temperature: No Abnormality N/A N/A Tenderness on No No N/A Palpation: Wound Preparation: Ulcer Cleansing: Ulcer Cleansing: Other: N/A Rinsed/Irrigated with soap and water Saline, Other: soap and water Topical Anesthetic Applied: Other: lidocaine Topical Anesthetic 4% Applied: Other: lidocaine 4% Treatment Notes Electronic Signature(s) Signed: 10/26/2014 4:34:55 PM By: Montey Hora Entered By: Montey Hora on 10/26/2014 15:20:05 Penny Clements ([id number]) -------------------------------------------------------------------------------- [place] Details Patient Name: Penny Clements Date of Service: 10/26/2014 2:45 PM Medical Record Number: [id number] Patient Account Number: [id number] Date of Birth/Sex: 1928-05-27 (81 y.o. Female) Treating RN: Montey Hora Primary Care Physician: Deborra Medina Other Clinician: Referring Physician: Deborra Medina Treating Physician/Extender: BURNS, Charlean Sanfilippo in Treatment: 16 Active Inactive Abuse / Safety / Falls / Self Care Management Nursing Diagnoses: Potential for falls Goals: Patient will remain injury free Date Initiated: 04/07/2014 Goal Status: Active Interventions: Assess fall risk on  admission and as needed Notes: Nutrition Nursing Diagnoses: Impaired glucose control: actual or potential Goals: Patient/caregiver agrees to and verbalizes understanding of need to use nutritional supplements and/or vitamins as prescribed Date Initiated: 04/07/2014 Goal Status: Active Interventions: Assess patient nutrition upon admission and as needed per policy Provide education on nutrition Notes: Orientation to the Wound Care Program Nursing Diagnoses: Knowledge deficit related to the wound healing center program Goals: NEKIA, MAXHAM ([id number]) Patient/caregiver will verbalize understanding of the [place] Program Date Initiated: 04/07/2014 Goal Status: Active Interventions: Provide education on orientation to the wound center Notes: Wound/Skin Impairment Nursing Diagnoses: Impaired tissue integrity Goals: Patient/caregiver will verbalize understanding of skin care regimen Date Initiated: 04/07/2014 Goal Status: Active Ulcer/skin breakdown will heal within 14 weeks Date Initiated: 04/07/2014 Goal Status: Active Interventions: Assess patient/caregiver ability to obtain necessary supplies Notes: Electronic Signature(s) Signed: 10/26/2014 4:34:55 PM By: Montey Hora Entered By: Montey Hora on 10/26/2014 15:19:54 Penny Clements ([id number]) -------------------------------------------------------------------------------- Patient/Caregiver Education Details Patient Name: Penny Clements Date of Service: 10/26/2014 2:45 PM Medical Record Number: [id number] Patient Account Number: [id number] Date of Birth/Gender: May 12, 1928 (81 y.o. Female) Treating RN: Montey Hora Primary Care Physician: Deborra Medina Other Clinician: Referring Physician: Deborra Medina Treating Physician/Extender: BURNS, Charlean Sanfilippo in Treatment: 28 Education  Assessment Education Provided To: Patient Education Topics Provided Wound/Skin Impairment: Handouts: Other:  call if problems with wrap Methods: Explain/Verbal Responses: State content correctly Electronic Signature(s) Signed: 10/26/2014 4:32:30 PM By: Montey Hora Entered By: Montey Hora on 10/26/2014 16:32:30 Penny Clements ([id number]) -------------------------------------------------------------------------------- Wound Assessment Details Patient Name: Penny Clements Date of Service: 10/26/2014 2:45 PM Medical Record Number: [id number] Patient Account Number: [id number] Date of Birth/Sex: May 02, 1928 (81 y.o. Female) Treating RN: Montey Hora Primary Care Physician: Deborra Medina Other Clinician: Referring Physician: Deborra Medina Treating Physician/Extender: BURNS, Charlean Sanfilippo in Treatment: 28 Wound Status Wound Number: 7 Primary Arterial Insufficiency Ulcer Etiology: Wound Location: Left Foot - Dorsal Wound Open Wounding Event: Gradually Appeared Status: Date Acquired: 09/20/2014 Comorbid Cataracts, Anemia, Type I Diabetes, Weeks Of Treatment: 5 History: Type II Diabetes, Neuropathy Clustered Wound: No Photos Photo Uploaded By: Gretta Cool, RN, BSN, Kim on 10/26/2014 17:36:09 Wound Measurements Length: (cm) 2.6 Width: (cm) 0.7 Depth: (cm) 0.1 Area: (cm) 1.429 Volume: (cm) 0.143 % Reduction in Area: -203.4% % Reduction in Volume: -52.1% Epithelialization: Small (1-33%) Tunneling: No Undermining: No Wound Description Full Thickness Without Foul Odor Aft Classification: Exposed Support Structures Diabetic Severity Grade 1 (Wagner): Wound Margin: Distinct, outline attached Exudate Amount: Small Exudate Type: Serous Exudate Color: amber er Cleansing: No Wound Bed Granulation Amount: Medium (34-66%) Exposed Structure Granulation Quality: Pink, Pale Fascia Exposed: No Belasco, Penny T. ([id number]) Necrotic Amount: Medium (34-66%) Fat Layer Exposed: No Necrotic Quality: Adherent Slough Tendon Exposed: No Muscle Exposed: No Joint Exposed: No Bone  Exposed: No Limited to Skin Breakdown Periwound Skin Texture Texture Color No Abnormalities Noted: No No Abnormalities Noted: No Callus: No Atrophie Blanche: No Crepitus: No Cyanosis: No Excoriation: No Ecchymosis: No Fluctuance: No Erythema: Yes Friable: No Hemosiderin Staining: No Induration: No Mottled: No Localized Edema: No Pallor: No Rash: No Rubor: No Scarring: No Temperature / Pain Moisture Temperature: No Abnormality No Abnormalities Noted: No Dry / Scaly: Yes Maceration: No Moist: Yes Wound Preparation Ulcer Cleansing: Rinsed/Irrigated with Saline, Other: soap and water, Topical Anesthetic Applied: Other: lidocaine 4%, Treatment Notes Wound #7 (Left, Dorsal Foot) 1. Cleansed with: Cleanse wound with antibacterial soap and water 2. Anesthetic Topical Lidocaine 4% cream to wound bed prior to debridement 4. Dressing Applied: Other dressing (specify in notes) 5. Secondary Dressing Applied Dry Gauze 7. Secured with Tape 4-Layer Compression System - Left Lower Extremity Notes epifix applied by MD today Electronic Signature(s) OLIVIYA, GILKISON ([id number]) Signed: 10/26/2014 4:34:55 PM By: Montey Hora Entered By: Montey Hora on 10/26/2014 15:18:50 Penny Clements ([id number]) -------------------------------------------------------------------------------- Wound Assessment Details Patient Name: Penny Clements Date of Service: 10/26/2014 2:45 PM Medical Record Number: [id number] Patient Account Number: [id number] Date of Birth/Sex: 02/13/1928 (81 y.o. Female) Treating RN: Montey Hora Primary Care Physician: Deborra Medina Other Clinician: Referring Physician: Deborra Medina Treating Physician/Extender: BURNS, Charlean Sanfilippo in Treatment: 28 Wound Status Wound Number: 8 Primary To be determined Etiology: Wound Location: Left Foot - Dorsal, Distal Wound Open Wounding Event: Gradually Appeared Status: Date Acquired: 10/26/2014 Comorbid  Cataracts, Anemia, Type I Diabetes, Weeks Of Treatment: 0 History: Type II Diabetes, Neuropathy Clustered Wound: No Photos Photo Uploaded By: Gretta Cool, RN, BSN, Kim on 10/26/2014 17:36:10 Wound Measurements Length: (cm) 2.2 Width: (cm) 1.5 Depth: (cm) 0.1 Area: (cm) 2.592 Volume: (cm) 0.259 % Reduction in Area: 0% % Reduction in Volume: 0% Epithelialization: Small (1-33%) Tunneling: No Undermining: No Wound Description Classification: Partial Thickness Foul Odor A Diabetic Severity (Wagner): Grade 1 Wound Margin: Flat and Intact  Exudate Amount: Medium Exudate Type: Serous Exudate Color: amber fter Cleansing: No Wound Bed Granulation Amount: Large (67-100%) Exposed Structure Granulation Quality: Pink Fascia Exposed: No Necrotic Amount: None Present (0%) Fat Layer Exposed: No Vandeven, Penny T. ([id number]) Tendon Exposed: No Muscle Exposed: No Joint Exposed: No Bone Exposed: No Limited to Skin Breakdown Periwound Skin Texture Texture Color No Abnormalities Noted: No No Abnormalities Noted: No Callus: No Atrophie Blanche: No Crepitus: No Cyanosis: No Excoriation: No Ecchymosis: No Fluctuance: No Erythema: No Friable: No Hemosiderin Staining: No Induration: No Mottled: No Localized Edema: No Pallor: No Rash: No Rubor: No Scarring: No Moisture No Abnormalities Noted: No Dry / Scaly: No Maceration: No Moist: No Wound Preparation Ulcer Cleansing: Other: soap and water, Topical Anesthetic Applied: Other: lidocaine 4%, Treatment Notes Wound #8 (Left, Distal, Dorsal Foot) 1. Cleansed with: Cleanse wound with antibacterial soap and water 2. Anesthetic Topical Lidocaine 4% cream to wound bed prior to debridement 4. Dressing Applied: Mepitel 5. Secondary Dressing Applied Foam 7. Secured with Tape 4-Layer Compression System - Left Lower Extremity Electronic Signature(s) Signed: 10/26/2014 4:34:55 PM By: Montey Hora Entered By: Montey Hora on  10/26/2014 15:19:36 Penny Clements ([name] ([id number]) -------------------------------------------------------------------------------- Vitals Details Patient Name: Penny Clements Date of Service: 10/26/2014 2:45 PM Medical Record Number: [id number] Patient Account Number: [id number] Date of Birth/Sex: August 01, 1927 (81 y.o. Female) Treating RN: Montey Hora Primary Care Physician: Deborra Medina Other Clinician: Referring Physician: Deborra Medina Treating Physician/Extender: BURNS, Charlean Sanfilippo in Treatment: 28 Vital Signs Time Taken: 15:05 Temperature (F): 97.6 Height (in): 63 Pulse (bpm): 103 Weight (lbs): 92 Respiratory Rate (breaths/min): 20 Body Mass Index (BMI): 16.3 Blood Pressure (mmHg): 131/71 Reference Range: 80 - 120 mg / dl Electronic Signature(s) Signed: 10/26/2014 4:34:55 PM By: Montey Hora Entered By: Montey Hora on 10/26/2014 15:05:50

## 2014-11-02 ENCOUNTER — Encounter: Payer: Medicare Other | Admitting: Surgery

## 2014-11-02 DIAGNOSIS — L97529 Non-pressure chronic ulcer of other part of left foot with unspecified severity: Secondary | ICD-10-CM | POA: Diagnosis not present

## 2014-11-03 NOTE — Progress Notes (Signed)
Clements, Penny ([id number]) Visit Report for 11/02/2014 Arrival Information Details Patient Name: Penny Clements, Penny Clements Date of Service: 11/02/2014 1:45 PM Medical Record Number: [id number] Patient Account Number: [id number] Date of Birth/Sex: [birth year] (81 y.o. Female) Treating RN: Afful, RN, BSN, Velva Harman Primary Care Physician: Deborra Medina Other Clinician: Referring Physician: Deborra Medina Treating Physician/Extender: Suella Grove in Treatment: 29 Visit Information History Since Last Visit Added or deleted any medications: No Patient Arrived: Walker Any new allergies or adverse reactions: No Arrival Time: 14:10 Had a fall or experienced change in No Accompanied By: self activities of daily living that may affect Transfer Assistance: None risk of falls: Patient Identification Verified: Yes Signs or symptoms of abuse/neglect since last No Secondary Verification Process Completed: Yes visito Patient Requires Transmission-Based No Hospitalized since last visit: No Precautions: Pain Present Now: No Patient Has Alerts: Yes Electronic Signature(s) Signed: 11/02/2014 5:48:59 PM By: Regan Lemming BSN, RN Entered By: Regan Lemming on 11/02/2014 14:18:30 Penny Clements ([id number]) -------------------------------------------------------------------------------- Encounter Discharge Information Details Patient Name: Penny Clements Date of Service: 11/02/2014 1:45 PM Medical Record Number: [id number] Patient Account Number: [id number] Date of Birth/Sex: 12/01/1927 (81 y.o. Female) Treating RN: Montey Hora Primary Care Physician: Deborra Medina Other Clinician: Referring Physician: Deborra Medina Treating Physician/Extender: Suella Grove in Treatment: 29 Encounter Discharge Information Items Discharge Pain Level: 0 Discharge Condition: Stable Ambulatory Status: Walker Discharge Destination: Home Transportation: Private Auto Accompanied By: self Schedule Follow-up Appointment:  Yes Medication Reconciliation completed No and provided to Patient/Care Gailya Tauer: Provided on Clinical Summary of Care: 11/02/2014 Form Type Recipient Paper Patient Brantley Signature(s) Signed: 11/02/2014 3:04:53 PM By: Ruthine Dose Entered By: Ruthine Dose on 11/02/2014 15:04:53 Penny Clements ([id number]) -------------------------------------------------------------------------------- Lower Extremity Assessment Details Patient Name: Penny Clements Date of Service: 11/02/2014 1:45 PM Medical Record Number: [id number] Patient Account Number: [id number] Date of Birth/Sex: 08-05-1927 (81 y.o. Female) Treating RN: Afful, RN, BSN, Velva Harman Primary Care Physician: Deborra Medina Other Clinician: Referring Physician: Deborra Medina Treating Physician/Extender: Suella Grove in Treatment: 29 Edema Assessment Assessed: [Left: No] [Right: No] E[Left: dema] [Right: :] Calf Left: Right: Point of Measurement: 33 cm From Medial Instep 26.7 cm cm Ankle Left: Right: Point of Measurement: 10 cm From Medial Instep 18.5 cm cm Vascular Assessment Pulses: Posterior Tibial Palpable: [Left:Yes] Dorsalis Pedis Palpable: [Left:Yes] Extremity colors, hair growth, and conditions: Extremity Color: [Left:Hyperpigmented] Hair Growth on Extremity: [Left:No] Temperature of Extremity: [Left:Warm] Capillary Refill: [Left:< 3 seconds] Toe Nail Assessment Left: Right: Thick: Yes Discolored: Yes Deformed: Yes Improper Length and Hygiene: No Electronic Signature(s) Signed: 11/02/2014 5:48:59 PM By: Regan Lemming BSN, RN Entered By: Regan Lemming on 11/02/2014 14:26:03 Penny Clements ([name] ([id number]) -------------------------------------------------------------------------------- Multi Wound Chart Details Patient Name: Penny Clements Date of Service: 11/02/2014 1:45 PM Medical Record Number: [id number] Patient Account Number: [id number] Date of Birth/Sex: 05/26/1928  (81 y.o. 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 To be determined] [N/A:N/A] Comorbid History: [7:Cataracts, Anemia, Type I Cataracts, Anemia, Type I N/A Diabetes, Type II Diabetes, Neuropathy] [8:Diabetes, Type II Diabetes, Neuropathy] Date Acquired: [7:09/20/2014] [8:10/26/2014] [N/A:N/A] Weeks of Treatment: [7:6] [8:1] [  N/A:N/A] Wound Status: [7:Open] [8:Open] [N/A:N/A] Measurements L x W x D 3.1x0.9x0.1 [8:2.2x1.5x0.1] [N/A:N/A] (cm) Area (cm) : [7:2.191] [8:2.592] [N/A:N/A] Volume (cm) : [7:0.219] [8:0.259] [N/A:N/A] % Reduction in Area: [7:-365.20%] [8:0.00%] [N/A:N/A] % Reduction in Volume: -133.00% [8:0.00%] [N/A:N/A] Classification: [7:Full Thickness Without Exposed Support Structures] [8:Partial Thickness] [N/A:N/A] HBO Classification: [7:Grade 1] [8:Grade 1] [N/A:N/A] Exudate Amount: [7:Small] [8:Medium] [N/A:N/A] Exudate Type: [7:Serous] [8:Serous] [N/A:N/A] Exudate Color: [7:amber] [8:amber] [N/A:N/A] Wound Margin: [7:Distinct, outline attached Flat and Intact] [N/A:N/A] Granulation Amount: [7:Medium (34-66%)] [8:Large (67-100%)] [N/A:N/A] Granulation Quality: [7:Pink, Pale] [8:Pink] [N/A:N/A] Necrotic Amount: [7:Medium (34-66%)] [8:None Present (0%)] [N/A:N/A] Exposed Structures: [7:Fascia: No Fat: No Tendon: No] [8:Fascia: No Fat: No Tendon: No] [N/A:N/A] Muscle: No Muscle: No Joint: No Joint: No Bone:  No Bone: No Limited to Skin Limited to Skin Breakdown Breakdown Epithelialization: Small (1-33%) Small (1-33%) N/A Periwound Skin Texture: Edema: No Edema: No N/A Excoriation: No Excoriation: No Induration: No Induration: No Callus: No Callus: No Crepitus: No Crepitus: No Fluctuance: No Fluctuance: No Friable: No Friable: No Rash: No Rash: No Scarring: No Scarring: No Periwound Skin Moist: Yes Maceration: No N/A Moisture: Dry/Scaly: Yes Moist: No Maceration: No Dry/Scaly: No Periwound Skin Color: Erythema: Yes Atrophie Blanche: No N/A Atrophie Blanche: No Cyanosis: No Cyanosis: No Ecchymosis: No Ecchymosis: No Erythema: No Hemosiderin Staining: No Hemosiderin Staining: No Mottled: No Mottled: No Pallor: No Pallor: No Rubor: No Rubor: No Temperature: No Abnormality N/A N/A Tenderness on No No N/A Palpation: Wound Preparation: Ulcer Cleansing: Ulcer Cleansing: Other: N/A Rinsed/Irrigated with soap and water Saline, Other: soap and water Topical Anesthetic Applied: Other: lidocaine Topical Anesthetic 4% Applied: Other: lidocaine 4% Treatment Notes Electronic Signature(s) Signed: 11/02/2014 5:48:59 PM By: Regan Lemming BSN, RN Entered By: Regan Lemming on 11/02/2014 14:39:25 Penny Clements ([id number]) -------------------------------------------------------------------------------- Multi-Disciplinary Care Plan Details Patient Name: Penny Clements Date of Service: 11/02/2014 1:45 PM Medical Record Number: [id number] Patient Account Number: [id number] Date of Birth/Sex: 1928/05/08 (81 y.o. Female) Treating RN: Baruch Gouty, RN, BSN, Velva Harman Primary Care Physician: Deborra Medina Other Clinician: Referring Physician: Deborra Medina Treating Physician/Extender: Suella Grove in Treatment: 98 Active Inactive Abuse / Safety / Falls / Self Care Management Nursing Diagnoses: Potential for falls Goals: Patient will remain injury free Date Initiated: 04/07/2014 Goal  Status: Active Interventions: Assess fall risk on admission and as needed Notes: Nutrition Nursing Diagnoses: Impaired glucose control: actual or potential Goals: Patient/caregiver agrees to and verbalizes understanding of need to use nutritional supplements and/or vitamins as prescribed Date Initiated: 04/07/2014 Goal Status: Active Interventions: Assess patient nutrition upon admission and as needed per policy Provide education on nutrition Notes: Orientation to the Wound Care Program Nursing Diagnoses: Knowledge deficit related to the wound healing center program Goals: KENNETHA, PEARMAN ([id number]) Patient/caregiver will verbalize understanding of the [facility] Date Initiated: 04/07/2014 Goal Status: Active Interventions: Provide education on orientation to the wound center Notes: Wound/Skin Impairment Nursing Diagnoses: Impaired tissue integrity Goals: Patient/caregiver will verbalize understanding of skin care regimen Date Initiated: 04/07/2014 Goal Status: Active Ulcer/skin breakdown will heal within 14 weeks Date Initiated: 04/07/2014 Goal Status: Active Interventions: Assess patient/caregiver ability to obtain necessary supplies Notes: Electronic Signature(s) Signed: 11/02/2014 5:48:59 PM By: Regan Lemming BSN, RN Entered By: Regan Lemming on 11/02/2014 14:39:14 Penny Clements ([id number]) -------------------------------------------------------------------------------- Patient/Caregiver Education Details Patient Name: Penny Clements Date of Service: 11/02/2014 1:45 PM Medical Record Number: [id number] Patient Account Number: [id number] Date of Birth/Gender: 09/13/1927 (81 y.o. Female) Treating RN: Montey Hora Primary Care Physician: Derrel Nip,  Helene Kelp Other Clinician: Referring Physician: Deborra Medina Treating Physician/Extender: Suella Grove in Treatment: 47 Education Assessment Education Provided To: Patient Education Topics  Provided Wound/Skin Impairment: Handouts: Skin Care Do's and Dont's Methods: Demonstration, Explain/Verbal Responses: State content correctly Electronic Signature(s) Signed: 11/02/2014 2:12:46 PM By: Montey Hora Entered By: Montey Hora on 11/02/2014 15:00:13 Penny Clements ([id number]) -------------------------------------------------------------------------------- Wound Assessment Details Patient Name: Penny Clements Date of Service: 11/02/2014 1:45 PM Medical Record Number: [id number] Patient Account Number: [id number] Date of Birth/Sex: March 11, 1928 (81 y.o. Female) Treating RN: Afful, RN, BSN, [place] Primary Care Physician: Deborra Medina Other Clinician: Referring Physician: Deborra Medina Treating Physician/Extender: Suella Grove in Treatment: 29 Wound Status Wound Number: 7 Primary Arterial Insufficiency Ulcer Etiology: Wound Location: Left Foot - Dorsal Wound Open Wounding Event: Gradually Appeared Status: Date Acquired: 09/20/2014 Comorbid Cataracts, Anemia, Type I Diabetes, Weeks Of Treatment: 6 History: Type II Diabetes, Neuropathy Clustered Wound: No Photos Photo Uploaded By: Montey Hora on 11/02/2014 17:04:26 Wound Measurements Length: (cm) 3.1 Width: (cm) 0.9 Depth: (cm) 0.1 Area: (cm) 2.191 Volume: (cm) 0.219 % Reduction in Area: -365.2% % Reduction in Volume: -133% Epithelialization: Small (1-33%) Tunneling: No Undermining: No Wound Description Full Thickness Without Foul Odor Aft Classification: Exposed Support Structures Diabetic Severity Grade 1 (Wagner): Wound Margin: Distinct, outline attached Exudate Amount: Small Exudate Type: Serous Exudate Color: amber er Cleansing: No Wound Bed Granulation Amount: Medium (34-66%) Exposed Structure Granulation Quality: Pink, Pale Fascia Exposed: No Coppa, Millenia T. ([id number]) Necrotic Amount: Medium (34-66%) Fat Layer Exposed: No Necrotic Quality: Adherent Slough Tendon Exposed:  No Muscle Exposed: No Joint Exposed: No Bone Exposed: No Limited to Skin Breakdown Periwound Skin Texture Texture Color No Abnormalities Noted: No No Abnormalities Noted: No Callus: No Atrophie Blanche: No Crepitus: No Cyanosis: No Excoriation: No Ecchymosis: No Fluctuance: No Erythema: Yes Friable: No Hemosiderin Staining: No Induration: No Mottled: No Localized Edema: No Pallor: No Rash: No Rubor: No Scarring: No Temperature / Pain Moisture Temperature: No Abnormality No Abnormalities Noted: No Dry / Scaly: Yes Maceration: No Moist: Yes Wound Preparation Ulcer Cleansing: Rinsed/Irrigated with Saline, Other: soap and water, Topical Anesthetic Applied: Other: lidocaine 4%, Treatment Notes Wound #7 (Left, Dorsal Foot) 1. Cleansed with: Cleanse wound with antibacterial soap and water 2. Anesthetic Topical Lidocaine 4% cream to wound bed prior to debridement 3. Peri-wound Care: Barrier cream 4. Dressing Applied: Promogran 5. Secondary Dressing Applied ABD Pad 7. Secured with 4-Layer Compression System - Left Lower Extremity Electronic Signature(s) Signed: 11/02/2014 5:48:59 PM By: Regan Lemming BSN, RN Barnum, Batool T. ([id number]) Entered By: Regan Lemming on 11/02/2014 14:33:57 Penny Clements ([id number]) -------------------------------------------------------------------------------- Wound Assessment Details Patient Name: Penny Clements Date of Service: 11/02/2014 1:45 PM Medical Record Number: [id number] Patient Account Number: [id number] Date of Birth/Sex: 06/17/1928 (81 y.o. Female) Treating RN: Afful, RN, BSN, [place] Primary Care Physician: Deborra Medina Other Clinician: Referring Physician: Deborra Medina Treating Physician/Extender: Suella Grove in Treatment: 29 Wound Status Wound Number: 8 Primary To be determined Etiology: Wound Location: Left Foot - Dorsal, Distal Wound Open Wounding Event: Gradually Appeared Status: Date Acquired:  10/26/2014 Comorbid Cataracts, Anemia, Type I Diabetes, Weeks Of Treatment: 1 History: Type II Diabetes, Neuropathy Clustered Wound: No Photos Photo Uploaded By: Montey Hora on 11/02/2014 17:04:48 Wound Measurements Length: (cm) 2.2 Width: (cm) 1.5 Depth: (cm) 0.1 Area: (cm) 2.592 Volume: (cm) 0.259 % Reduction in Area: 0% % Reduction in Volume: 0% Epithelialization: Small (1-33%) Tunneling: No Undermining: No Wound Description Classification: Partial Thickness Foul Odor A Diabetic Severity Earleen Newport): Grade  1 Wound Margin: Flat and Intact Exudate Amount: Medium Exudate Type: Serous Exudate Color: amber fter Cleansing: No Wound Bed Granulation Amount: Large (67-100%) Exposed Structure Granulation Quality: Pink Fascia Exposed: No Necrotic Amount: None Present (0%) Fat Layer Exposed: No Berens, Alisia T. ([id number]) Tendon Exposed: No Muscle Exposed: No Joint Exposed: No Bone Exposed: No Limited to Skin Breakdown Periwound Skin Texture Texture Color No Abnormalities Noted: No No Abnormalities Noted: No Callus: No Atrophie Blanche: No Crepitus: No Cyanosis: No Excoriation: No Ecchymosis: No Fluctuance: No Erythema: No Friable: No Hemosiderin Staining: No Induration: No Mottled: No Localized Edema: No Pallor: No Rash: No Rubor: No Scarring: No Moisture No Abnormalities Noted: No Dry / Scaly: No Maceration: No Moist: No Wound Preparation Ulcer Cleansing: Other: soap and water, Topical Anesthetic Applied: Other: lidocaine 4%, Treatment Notes Wound #8 (Left, Distal, Dorsal Foot) 1. Cleansed with: Cleanse wound with antibacterial soap and water 2. Anesthetic Topical Lidocaine 4% cream to wound bed prior to debridement 3. Peri-wound Care: Barrier cream 4. Dressing Applied: Promogran 5. Secondary Dressing Applied ABD Pad 7. Secured with 4-Layer Compression System - Left Lower Extremity Electronic Signature(s) Signed: 11/02/2014 5:48:59 PM  By: Regan Lemming BSN, RN Entered By: Regan Lemming on 11/02/2014 14:34:13 Penny Clements ([name] ([id number]) -------------------------------------------------------------------------------- Vitals Details Patient Name: Penny Clements Date of Service: 11/02/2014 1:45 PM Medical Record Number: [id number] Patient Account Number: [id number] Date of Birth/Sex: 05-Jul-1927 (82 y.o. Female) Treating RN: Afful, RN, BSN, [place] Primary Care Physician: Deborra Medina Other Clinician: Referring Physician: Deborra Medina Treating Physician/Extender: Suella Grove in Treatment: 29 Vital Signs Time Taken: 14:18 Temperature (F): 97.5 Height (in): 63 Pulse (bpm): 105 Weight (lbs): 92 Respiratory Rate (breaths/min): 18 Body Mass Index (BMI): 16.3 Blood Pressure (mmHg): 153/77 Reference Range: 80 - 120 mg / dl Electronic Signature(s) Signed: 11/02/2014 5:48:59 PM By: Regan Lemming BSN, RN Entered By: Regan Lemming on 11/02/2014 14:19:24

## 2014-11-03 NOTE — Progress Notes (Signed)
LEOMIA, BLAKE ([id number]) Visit Report for 11/02/2014 Chief Complaint Document Details Patient Name: Penny Clements, Penny Clements Date of Service: 11/02/2014 1:45 PM Medical Record Number: [id number] Patient Account Number: [id number] Date of Birth/Sex: Nov 07, 1927 (81 y.o. Female) Treating RN: Montey Hora Primary Care Physician: Deborra Medina Other Clinician: Referring Physician: Deborra Medina Treating Physician/Extender: Suella Grove in Treatment: 29 Information Obtained from: Patient Chief Complaint Recurrent left lateral foot ulcers. Electronic Signature(s) Signed: 11/02/2014 3:52:37 PM By: Loletha Grayer MD Entered By: Loletha Grayer on 11/02/2014 14:58:23 Penny Clements ([id number]) -------------------------------------------------------------------------------- Debridement Details Patient Name: Penny Clements Date of Service: 11/02/2014 1:45 PM Medical Record Number: [id number] Patient Account Number: [id number] Date of Birth/Sex: Jan 22, 1928 (81 y.o. Female) Treating RN: Montey Hora Primary Care Physician: Deborra Medina Other Clinician: Referring Physician: Deborra Medina Treating Physician/Extender: Suella Grove in Treatment: 29 Debridement Performed for Wound #7 Left,Dorsal Foot Assessment: Performed By: Physician , Debridement: Debridement Pre-procedure Yes Verification/Time Out Taken: Start Time: 14:39 Pain Control: Lidocaine 4% Topical Solution Level: Skin/Subcutaneous Tissue Total Area Debrided (L x 3.1 (cm) x 0.9 (cm) = 2.79 (cm) W): Tissue and other Viable, Non-Viable, Exudate, Fat, Fibrin/Slough, Subcutaneous material debrided: Instrument: Curette, Scissors Bleeding: Minimum Hemostasis Achieved: Silver Nitrate End Time: 14:43 Procedural Pain: 0 Post Procedural Pain: 0 Response to Treatment: Procedure was tolerated well Post Debridement Measurements of Total Wound Length: (cm) 3.1 Width: (cm) 0.9 Depth: (cm) 0.2 Volume: (cm) 0.438 Electronic  Signature(s) Signed: 11/02/2014 3:52:37 PM By: Loletha Grayer MD Signed: 11/02/2014 2:12:46 PM By: Montey Hora Entered By: Loletha Grayer on 11/02/2014 14:58:07 Penny Clements ([id number]) -------------------------------------------------------------------------------- HPI Details Patient Name: Penny Clements Date of Service: 11/02/2014 1:45 PM Medical Record Number: [id number] Patient Account Number: [id number] Date of Birth/Sex: 1927/08/19 (81 y.o. Female) Treating RN: Montey Hora Primary Care Physician: Deborra Medina Other Clinician: Referring Physician: Deborra Medina Treating Physician/Extender: Suella Grove in Treatment: 29 History of Present Illness HPI Description: Very pleasant 81 year old with a past medical history significant for type 2 diabetes and lung cancer (status post chemoradiation). She has had a recurrent left diabetic foot ulcer for almost 10 years, which has healed in the past with compression therapy. This recently recurred in October 2015. No history of trauma. No significant pain. No symptoms to indicate ischemic rest pain or claudication. ABI 1.11. Ambulating per her baseline with a walker. Culture on 05/18/2014 grew methicillin sensitive staph aureus and staph lugdunensis, both sensitive to tetracycline. She was started on doxycycline but developed severe itching. She does not want anymore antibiotics. Punch biopsy on 06/29/2014 showed no evidence for malignancy. Consistent with stasis dermatitis. Underwent normal CVI ultrasound at [facility] in March 2016. Original ulcer has healed. She developed a second adjacent ulceration dorsally. Treating with regular debridements and 4 layer compression. Improved with epifix x8 (initially applied on 07/06/2014). She returns to clinic for follow-up and is without new complaints. No trauma. No significant pain. No fever or chills. No significant drainage. Electronic Signature(s) Signed:  11/02/2014 3:52:37 PM By: Loletha Grayer MD Entered By: Loletha Grayer on 11/02/2014 14:58:40 Penny Clements ([id number]) -------------------------------------------------------------------------------- Physical Exam Details Patient Name: Penny Clements Date of Service: 11/02/2014 1:45 PM Medical Record Number: [id number] Patient Account Number: [id number] Date of Birth/Sex: 03/14/1928 (81 y.o. Female) Treating RN: Montey Hora Primary Care Physician: Deborra Medina Other Clinician: Referring Physician: Deborra Medina Treating Physician/Extender: Suella Grove in Treatment: 29 Constitutional . Pulse regular. Respirations normal and unlabored. Afebrile. . Notes Original left lateral foot ulceration is completely  re-epithelialized. No drainage. No infection. More recent left dorsal foot ulceration is improved. No active infection. Edema well controlled. Palpable DP. Electronic Signature(s) Signed: 11/02/2014 3:52:37 PM By: Loletha Grayer MD Entered By: Loletha Grayer on 11/02/2014 14:59:15 Penny Clements ([id number]) -------------------------------------------------------------------------------- Physician Orders Details Patient Name: Penny Clements Date of Service: 11/02/2014 1:45 PM Medical Record Number: [id number] Patient Account Number: [id number] Date of Birth/Sex: 04/15/1928 (81 y.o. Female) Treating RN: Baruch Gouty, RN, BSN, Velva Harman Primary Care Physician: Deborra Medina Other Clinician: Referring Physician: Deborra Medina Treating Physician/Extender: Suella Grove in Treatment: 29 Verbal / Phone Orders: Yes Clinician: Afful, RN, BSN, Rita Read Back and Verified: Yes Diagnosis Coding Wound Cleansing Wound #7 Left,Dorsal Foot o May shower with protection. o No tub bath. Skin Barriers/Peri-Wound Care Wound #7 Left,Dorsal Foot o Barrier cream Wound #8 Left,Distal,Dorsal Foot o Barrier cream Primary Wound Dressing Wound #7 Left,Dorsal Foot o  Promogran Secondary Dressing Wound #7 Left,Dorsal Foot o ABD pad Dressing Change Frequency Wound #7 Left,Dorsal Foot o Change dressing every week Follow-up Appointments Wound #7 Left,Dorsal Foot o Return Appointment in 1 week. Edema Control Wound #7 Left,Dorsal Foot o 4-Layer Compression System - Left Lower Extremity Electronic Signature(s) Signed: 11/02/2014 5:48:59 PM By: Regan Lemming BSN, RN [place], Sherron T. ([id number]) Entered By: Regan Lemming on 11/02/2014 14:46:25 Penny Clements ([id number]) -------------------------------------------------------------------------------- Problem List Details Patient Name: Penny Clements Date of Service: 11/02/2014 1:45 PM Medical Record Number: [id number] Patient Account Number: [id number] Date of Birth/Sex: 1928/06/13 (81 y.o. 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 07/06/2014 Yes ankle and inflammation Inactive Problems ICD-10 Code Description Active Date Inactive Date L03.116 Cellulitis of left lower limb 05/18/2014 05/25/2014 Resolved Problems Electronic Signature(s) Signed: 11/02/2014 3:52:37 PM By: Loletha Grayer MD Entered By: Loletha Grayer on 11/02/2014 14:57:37 Penny Clements ([id number]) -------------------------------------------------------------------------------- Progress Note Details Patient Name: Penny Clements Date of Service: 11/02/2014 1:45 PM Medical Record Number: [id number] Patient Account Number: [id number] Date of Birth/Sex: March 10, 1928 (81 y.o.  Female) Treating RN: Montey Hora Primary Care Physician: Deborra Medina Other Clinician: Referring Physician: Deborra Medina Treating Physician/Extender: Suella Grove in Treatment: 29 Subjective Chief Complaint Information obtained from Patient Recurrent left lateral foot ulcers. History of Present Illness (HPI) Very pleasant 81 year old with a past medical history significant for type 2 diabetes and lung cancer (status post chemoradiation). She has had a recurrent left diabetic foot ulcer for almost 10 years, which has healed in the past with compression therapy. This recently recurred in October 2015. No history of trauma. No significant pain. No symptoms to indicate ischemic rest pain or claudication. ABI 1.11. Ambulating per her baseline with a walker. Culture on 05/18/2014 grew methicillin sensitive staph aureus and staph lugdunensis, both sensitive to tetracycline. She was started on doxycycline but developed severe itching. She does not want anymore antibiotics. Punch biopsy on 06/29/2014 showed no evidence for malignancy. Consistent with stasis dermatitis. [facility]. Original ulcer has healed. She developed a second adjacent ulceration dorsally. Treating with regular debridements and 4 layer compression. Improved with epifix x8 (initially applied on 07/06/2014). She returns to clinic for follow-up and is without  new complaints. No trauma. No significant pain. No fever or chills. No significant drainage. Objective Constitutional Pulse regular. Respirations normal and unlabored. Afebrile. Vitals Time Taken: 2:18 PM, Height: 63 in, Weight: 92 lbs, BMI: 16.3, Temperature: 97.5 F, Pulse: 105 bpm, Respiratory Rate: 18 breaths/min, Blood Pressure: 153/77 mmHg. Penny Clements, Penny Clements Kitchen ([id number]) General Notes: Original left lateral foot ulceration is completely re-epithelialized. No drainage. No infection. More recent left  dorsal foot ulceration is improved. No active infection. Edema well controlled. Palpable DP. Integumentary (Hair, Skin) Wound #7 status is Open. Original cause of wound was Gradually Appeared. The wound is located on the Left,Dorsal Foot. The wound measures 3.1cm length x 0.9cm width x 0.1cm depth; 2.191cm^2 area and 0.219cm^3 volume. The wound is limited to skin breakdown. There is no tunneling or undermining noted. There is a small amount of serous drainage noted. The wound margin is distinct with the outline attached to the wound base. There is medium (34-66%) pink, pale granulation within the wound bed. There is a medium (34-66%) amount of necrotic tissue within the wound bed including Adherent Slough. The periwound skin appearance exhibited: Dry/Scaly, Moist, Erythema. The periwound skin appearance did not exhibit: Callus, Crepitus, Excoriation, Fluctuance, Friable, Induration, Localized Edema, Rash, Scarring, Maceration, Atrophie Blanche, Cyanosis, Ecchymosis, Hemosiderin Staining, Mottled, Pallor, Rubor. The surrounding wound skin color is noted with erythema. Periwound temperature was noted as No Abnormality. Wound #8 status is Open. Original cause of wound was Gradually Appeared. The wound is located on the Left,Distal,Dorsal Foot. The wound measures 2.2cm length x 1.5cm width x 0.1cm depth; 2.592cm^2 area and 0.259cm^3 volume. The wound is limited to skin breakdown. There is no tunneling or undermining noted. There is a medium amount of serous drainage noted. The wound margin is flat and intact. There is large (67-100%) pink granulation within the wound bed. There is no necrotic tissue within the wound bed. The periwound skin appearance did not exhibit: Callus, Crepitus, Excoriation, Fluctuance, Friable, Induration, Localized Edema, Rash, Scarring, Dry/Scaly, Maceration, Moist, Atrophie Blanche, Cyanosis, Ecchymosis, Hemosiderin Staining, Mottled, Pallor, Rubor,  Erythema. 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 There was a Skin/Subcutaneous Tissue Debridement ([id number]) debridement with total area of 2.79 sq cm Vanaman, Anjanette T. ([id number]) performed by . with the following instrument(s): Curette and Scissors to remove Viable and Non-Viable tissue/material including Exudate, Fat, Fibrin/Slough, and Subcutaneous after achieving pain control using Lidocaine 4% Topical Solution. A time out was conducted prior to the start of the procedure. A Minimum amount of bleeding was controlled with Silver Nitrate. The procedure was tolerated well with a pain level of 0 throughout and a pain level of 0 following the procedure. Post Debridement Measurements: 3.1cm length x 0.9cm width x 0.2cm depth; 0.438cm^3 volume. Plan Wound Cleansing: Wound #7 Left,Dorsal Foot: May shower with protection. No tub bath. Skin Barriers/Peri-Wound Care: Wound #7 Left,Dorsal Foot: Barrier cream Wound #8 Left,Distal,Dorsal Foot: Barrier cream Primary Wound Dressing: Wound #7 Left,Dorsal Foot: Promogran Secondary Dressing: Wound #7 Left,Dorsal Foot: ABD pad Dressing Change Frequency: Wound #7 Left,Dorsal Foot: Change dressing every week Follow-up Appointments: Wound #7 Left,Dorsal Foot: Return Appointment in 1 week. Edema Control: Wound #7 Left,Dorsal Foot: 4-Layer Compression System - Left Lower Extremity Promogran. 4-layer  compression. Order Epifix #9. Electronic Signature(s) Signed: 11/02/2014 3:52:37 PM By: Loletha Grayer MD Penny Clements ([id number]) Entered By: Loletha Grayer on 11/02/2014  14:59:53 Penny Clements ([id number]) -------------------------------------------------------------------------------- SuperBill Details Patient Name: Penny Clements Date of Service: 11/02/2014 Medical Record Number: [id number] Patient Account Number: [id number] Date of Birth/Sex: 11-21-1927 (81 y.o. 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

## 2014-11-09 ENCOUNTER — Encounter: Payer: Medicare Other | Admitting: Surgery

## 2014-11-09 DIAGNOSIS — L97529 Non-pressure chronic ulcer of other part of left foot with unspecified severity: Secondary | ICD-10-CM | POA: Diagnosis not present

## 2014-11-10 NOTE — Progress Notes (Signed)
Penny Clements, Penny Clements ([id number]) Visit Report for 11/09/2014 Chief Complaint Document Details Patient Name: Penny Clements, Penny Clements Date of Service: 11/09/2014 1:45 PM Medical Record Number: [id number] Patient Account Number: [id number] Date of Birth/Sex: 08/07/1927 (81 y.o. Female) Treating RN: Primary Care Physician: Deborra Medina Other Clinician: Referring Physician: Deborra Medina Treating Physician/Extender: BURNS, Charlean Sanfilippo in Treatment: 30 Information Obtained from: Patient Chief Complaint Recurrent left lateral foot ulcers. Electronic Signature(s) Signed: 11/09/2014 1:50:02 PM By: Loletha Grayer MD Entered By: Loletha Grayer on 11/09/2014 59:56:38 Penny Clements ([id number]) -------------------------------------------------------------------------------- Cellular or Tissue Based Product Details Patient Name: Penny Clements Date of Service: 11/09/2014 1:45 PM Medical Record Number: [id number] Patient Account Number: [id number] Date of Birth/Sex: 1927/09/27 (81 y.o. Female) Treating RN: Primary Care Physician: Deborra Medina Other Clinician: Referring Physician: Deborra Medina Treating Physician/Extender: BURNS, Charlean Sanfilippo in Treatment: 30 Cellular or Tissue Based Wound #7 Left,Dorsal Foot Product Type Applied to: Performed By: Physician BURNS, Teressa Senter., MD Cellular or Tissue Based Epifix Product Type: Time-Out Taken: Yes Location: genitalia / hands / feet / multiple digits Wound Size (sq cm): 3.41 Product Size (sq cm): 2 Waste Size (sq cm): 0 Amount of Product Applied (sq cm): 2 Lot #: [id number] Order #: [id number] Expiration Date: 07/26/2019 Fenestrated: No Reconstituted: No Secured: Yes Secured With: Steri-Strips Dressing Applied: Yes Primary Dressing: contact layer/adaptic Procedural Pain: 0 Post Procedural Pain: 0 Response to Treatment: Procedure was tolerated well Electronic Signature(s) Signed: 11/09/2014 1:50:02 PM By: Loletha Grayer  MD Entered By: Loletha Grayer on 11/09/2014 16:28:16 Penny Clements ([id number]) -------------------------------------------------------------------------------- Debridement Details Patient Name: Penny Clements Date of Service: 11/09/2014 1:45 PM Medical Record Number: [id number] Patient Account Number: [id number] Date of Birth/Sex: [birth year] (81 y.o. Female) Treating RN: Primary Care Physician: Deborra Medina Other Clinician: Referring Physician: Deborra Medina Treating Physician/Extender: BURNS, Charlean Sanfilippo in Treatment: 30 Debridement Performed for Wound #7 Left,Dorsal Foot Assessment: Performed By: Physician BURNS, Teressa Senter., MD Debridement: Debridement Pre-procedure Yes Verification/Time Out Taken: Start Time: 14:28 Pain Control: Lidocaine 4% Topical Solution Level: Skin/Subcutaneous Tissue Total Area Debrided (L x 3.1 (cm) x 1.1 (cm) = 3.41 (cm) W): Tissue and other Viable, Non-Viable, Eschar, Exudate, Fat, Fibrin/Slough, Subcutaneous material debrided: Instrument: Curette Bleeding: Moderate Hemostasis Achieved: Silver Nitrate End Time: 14:29 Procedural Pain: 0 Post Procedural Pain: 0 Response to Treatment: Procedure was tolerated well Post Debridement Measurements of Total Wound Length: (cm) 3.1 Width: (cm) 1.1 Depth: (cm) 0.2 Volume: (cm) 0.536 Electronic Signature(s) Signed: 11/09/2014 1:50:02 PM By: Loletha Grayer MD Previous Signature: 11/09/2014 3:18:58 PM Version By: Loletha Grayer MD Entered By: Loletha Grayer on 11/09/2014 16:27:54 Penny Clements ([id number]) -------------------------------------------------------------------------------- HPI Details Patient Name: Penny Clements Date of Service: 11/09/2014 1:45 PM Medical Record Number: [id number] Patient Account Number: [id number] Date of Birth/Sex: Jan 25, 1928 (81 y.o. Female) Treating RN: Primary Care Physician: Deborra Medina Other Clinician: Referring Physician:  Deborra Medina Treating Physician/Extender: BURNS, Charlean Sanfilippo in Treatment: 45 History of Present Illness HPI Description: Very pleasant 81 year old with a past medical history significant for type 2 diabetes and lung cancer (status post chemoradiation). She has had a recurrent left diabetic foot ulcer for almost 10 years, which has healed in the past with compression therapy. This recently recurred in October 2015. No history of trauma. No significant pain. No symptoms to indicate ischemic rest pain or claudication. ABI 1.11. Ambulating per her baseline with a walker. Culture on 05/18/2014 grew methicillin sensitive staph aureus and staph lugdunensis, both sensitive to  tetracycline. She was started on doxycycline but developed severe itching. She does not want anymore antibiotics. Punch biopsy on 06/29/2014 showed no evidence for malignancy. Consistent with stasis dermatitis. Underwent normal CVI ultrasound at [facility] in March 2016. Original ulcer has healed. She developed a second adjacent ulceration dorsally. Treating with regular debridements and 4 layer compression. Improved with epifix x7 (initially applied on 07/06/2014). She returns to clinic for follow-up and is without new complaints. No trauma. No significant pain. No fever or chills. No significant drainage. Electronic Signature(s) Signed: 11/09/2014 1:50:02 PM By: Loletha Grayer MD Entered By: Loletha Grayer on 11/09/2014 16:28:36 Penny Clements ([id number]) -------------------------------------------------------------------------------- Physical Exam Details Patient Name: Penny Clements Date of Service: 11/09/2014 1:45 PM Medical Record Number: [id number] Patient Account Number: [id number] Date of Birth/Sex: 1927-07-26 (81 y.o. Female) Treating RN: Primary Care Physician: Deborra Medina Other Clinician: Referring Physician: Deborra Medina Treating Physician/Extender: BURNS, Charlean Sanfilippo in  Treatment: 30 Constitutional . Pulse regular. Respirations normal and unlabored. Afebrile. . Notes Original left lateral foot ulceration is completely re-epithelialized. No drainage. No infection. More recent left dorsal foot ulceration is unchanged. No active infection. Edema well controlled. Palpable DP. Electronic Signature(s) Signed: 11/09/2014 1:50:02 PM By: Loletha Grayer MD Entered By: Loletha Grayer on 11/09/2014 16:29:10 Penny Clements ([id number]) -------------------------------------------------------------------------------- Physician Orders Details Patient Name: Penny Clements Date of Service: 11/09/2014 1:45 PM Medical Record Number: [id number] Patient Account Number: [id number] Date of Birth/Sex: 06/15/1928 (81 y.o. Female) Treating RN: Baruch Gouty, RN, BSN, Velva Harman Primary Care Physician: Deborra Medina Other Clinician: Referring Physician: Deborra Medina Treating Physician/Extender: BURNS, Charlean Sanfilippo in Treatment: 47 Verbal / Phone Orders: Yes Clinician: Afful, RN, BSN, Rita Read Back and Verified: Yes Diagnosis Coding Wound Cleansing Wound #7 Left,Dorsal Foot o May shower with protection. o No tub bath. Primary Wound Dressing Wound #7 Left,Dorsal Foot o Other: - Epifix 46mm applied Secondary Dressing Wound #7 Left,Dorsal Foot o ABD pad Dressing Change Frequency Wound #7 Left,Dorsal Foot o Change dressing every week Follow-up Appointments Wound #7 Left,Dorsal Foot o Return Appointment in 1 week. Edema Control Wound #7 Left,Dorsal Foot o 4-Layer Compression System - Left Lower Extremity Electronic Signature(s) Signed: 11/09/2014 3:18:58 PM By: Loletha Grayer MD Signed: 11/09/2014 5:13:49 PM By: Regan Lemming BSN, RN Entered By: Regan Lemming on 11/09/2014 14:34:56 Penny Clements ([id number]) -------------------------------------------------------------------------------- Problem List Details Patient Name: Penny Clements Date of Service: 11/09/2014 1:45 PM Medical Record Number: [id number] Patient Account Number: [id number] Date of Birth/Sex: Aug 03, 1927 (81 y.o. 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 of 07/06/2014 Yes ankle and inflammation Inactive Problems ICD-10 Code Description Active Date Inactive Date L03.116 Cellulitis of left lower limb 05/18/2014 05/25/2014 Resolved Problems Electronic Signature(s) Signed: 11/09/2014 1:50:02 PM By: Loletha Grayer MD Entered By: Loletha Grayer on 11/09/2014 16:27:27 Penny Clements ([id number]) -------------------------------------------------------------------------------- Progress Note Details Patient Name: Penny Clements Date of Service: 11/09/2014 1:45 PM Medical Record Number: [id number] Patient Account Number: [id number] Date of Birth/Sex: 1928/04/26 (81 y.o. Female) Treating RN: Primary Care Physician: Deborra Medina Other Clinician: Referring Physician: Deborra Medina Treating Physician/Extender: BURNS, Charlean Sanfilippo in Treatment: 30 Subjective Chief Complaint  Information obtained from Patient Recurrent left lateral foot ulcers. History of Present Illness (HPI) Very pleasant 81 year old with a past medical history significant for type 2 diabetes and lung cancer (status post chemoradiation). She has had a recurrent left diabetic foot ulcer for almost 10 years, which has healed in the past with compression therapy. This recently recurred in October 2015. No history of trauma. No significant pain. No symptoms to indicate  ischemic rest pain or claudication. ABI 1.11. Ambulating per her baseline with a walker. Culture on 05/18/2014 grew methicillin sensitive staph aureus and staph lugdunensis, both sensitive to tetracycline. She was started on doxycycline but developed severe itching. She does not want anymore antibiotics. Punch biopsy on 06/29/2014 showed no evidence for malignancy. Consistent with stasis dermatitis. Underwent normal CVI ultrasound at [facility] in March 2016. Original ulcer has healed. She developed a second adjacent ulceration dorsally. Treating with regular debridements and 4 layer compression. Improved with epifix x7 (initially applied on 07/06/2014). She returns to clinic for follow-up and is without new complaints. No trauma. No significant pain. No fever or chills. No significant drainage. Objective Constitutional Pulse regular. Respirations normal and unlabored. Afebrile. Vitals Time Taken: 1:45 PM, Height: 63 in, Weight: 92 lbs, BMI: 16.3, Temperature: 97.6 F, Pulse: 107 bpm, Respiratory Rate: 18 breaths/min, Blood Pressure: 118/88 mmHg. Penny Clements, Penny Clements Kitchen ([id number]) General Notes: Original left lateral foot ulceration is completely re-epithelialized. No drainage. No infection. More recent left dorsal foot ulceration is unchanged. No active infection. Edema well controlled. Palpable DP. Integumentary (Hair, Skin) Wound #7 status is Open. Original cause of wound was Gradually Appeared. The wound is located on the Left,Dorsal Foot. The wound measures 3.1cm length x 1.1cm width x 0.1cm depth; 2.678cm^2 area and 0.268cm^3 volume. The wound is limited to skin breakdown. There is no tunneling or undermining noted. There is a small amount of serous drainage noted. The wound margin is distinct with the outline attached to the wound base. There is medium (34-66%) pink, pale granulation within the wound bed. There is a medium (34-66%) amount of necrotic tissue within the  wound bed including Adherent Slough. The periwound skin appearance exhibited: Dry/Scaly, Moist, Erythema. The periwound skin appearance did not exhibit: Callus, Crepitus, Excoriation, Fluctuance, Friable, Induration, Localized Edema, Rash, Scarring, Maceration, Atrophie Blanche, Cyanosis, Ecchymosis, Hemosiderin Staining, Mottled, Pallor, Rubor. The surrounding wound skin color is noted with erythema. Periwound temperature was noted as No Abnormality. Wound #8 status is Healed - Epithelialized. Original cause of wound was Gradually Appeared. The wound is located on the Left,Distal,Dorsal Foot. The wound measures 0cm length x 0cm width x 0cm depth; 0cm^2 area and 0cm^3 volume. 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 There was a Skin/Subcutaneous Tissue Debridement ([id number]) debridement with total area of 3.41 sq cm performed by BURNS, Teressa Senter., MD. with the following instrument(s): Curette to remove Viable and Non-Viable tissue/material including Exudate, Fat, Fibrin/Slough, Eschar, and Subcutaneous after achieving pain control using Lidocaine 4% Topical Solution. A time out was conducted prior to the start of the procedure. A Moderate amount of bleeding was controlled with Silver Nitrate. The procedure was tolerated Krass, Viva T. ([id number]) well with a pain level of 0 throughout and a pain  level of 0 following the procedure. Post Debridement Measurements: 3.1cm length x 1.1cm width x 0.2cm depth; 0.536cm^3 volume. 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 A skin graft procedure using a bioengineered skin substitute/cellular  or tissue based product was performed by BURNS, Teressa Senter., MD. Epifix was applied and secured with Steri-Strips. 2 sq cm of product was utilized and 0 sq cm was wasted. Post Application, contact layer/adaptic was applied. A Time Out was conducted prior to the start of the procedure. The procedure was tolerated well with a pain level of 0 throughout and a pain level of 0 following the procedure. Plan Wound Cleansing: Wound #7 Left,Dorsal Foot: May shower with protection. No tub bath. Primary Wound Dressing: Wound #7 Left,Dorsal Foot: Other: - Epifix 76mm applied Secondary Dressing: Wound #7 Left,Dorsal Foot: ABD pad Dressing Change Frequency: Wound #7 Left,Dorsal Foot: Change dressing every week Follow-up Appointments: Wound #7 Left,Dorsal Foot: Return Appointment in 1 week. Edema Control: Wound #7 Left,Dorsal Foot: 4-Layer Compression System - Left Lower Extremity Epifix #8 applied. 4-layer compression bandage. Electronic Signature(s) Signed: 11/09/2014 1:50:02 PM By: Loletha Grayer MD Entered By: Loletha Grayer on 11/09/2014 16:29:36 Penny Clements ([name] ([id number]) -------------------------------------------------------------------------------- SuperBill Details Patient Name: Penny Clements Date of Service: 11/09/2014 Medical Record Number: [id number] Patient Account Number: [id number] Date of Birth/Sex: [birth year] (81 y.o. 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 of ankle and inflammation Facility Procedures CPT4 Code: [id number] Description: (Facility Use Only) H6861  o Epifix o Per 1 SQ CM Modifier: Quantity: 2 CPT4 Code: [id number] Description: 11155 - SKIN SUB GRAFT FACE/NK/HF/G ICD-10 Description Diagnosis E11.621 Type 2 diabetes mellitus with foot ulcer Modifier: Quantity: 1 Physician Procedures CPT4 Code: 2080223 Description: 36122 - WC PHYS SKIN SUB GRAFT FACE/NK/HF/G ICD-10 Description Diagnosis E11.621 Type 2 diabetes mellitus with foot ulcer Modifier: Quantity: 1 Electronic Signature(s) Signed: 11/09/2014 1:50:02 PM By: Loletha Grayer MD Entered By: Loletha Grayer on 11/09/2014 16:29:55

## 2014-11-10 NOTE — Progress Notes (Signed)
Penny Clements, Penny Clements ([id number]) Visit Report for 11/09/2014 Arrival Information Details Patient Name: Penny Clements, Penny Clements Date of Service: 11/09/2014 1:45 PM Medical Record Number: [id number] Patient Account Number: [id number] Date of Birth/Sex: 1927-09-29 (81 y.o. Female) Treating RN: Montey Hora Primary Care Physician: Deborra Medina Other Clinician: Referring Physician: Deborra Medina Treating Physician/Extender: BURNS, Charlean Sanfilippo in Treatment: 49 Visit Information History Since Last Visit Added or deleted any medications: No Patient Arrived: Walker Any new allergies or adverse reactions: No Arrival Time: 13:42 Had a fall or experienced change in No Accompanied By: self activities of daily living that may affect Transfer Assistance: None risk of falls: Patient Identification Verified: Yes Signs or symptoms of abuse/neglect since last No Secondary Verification Process Completed: Yes visito Patient Requires Transmission-Based No Hospitalized since last visit: No Precautions: Pain Present Now: No Patient Has Alerts: Yes Electronic Signature(s) Signed: 11/09/2014 4:53:20 PM By: Sharon Mt Entered By: Sharon Mt on 11/09/2014 16:53:20 Penny Clements ([id number]) -------------------------------------------------------------------------------- Encounter Discharge Information Details Patient Name: Penny Clements Date of Service: 11/09/2014 1:45 PM Medical Record Number: [id number] Patient Account Number: [id number] Date of Birth/Sex: [birth year] (81 y.o. Female) Treating RN: Primary Care Physician: Deborra Medina Other Clinician: Referring Physician: Deborra Medina Treating Physician/Extender: BURNS, Charlean Sanfilippo in Treatment: 30 Encounter Discharge Information Items Discharge Pain Level: 0 Discharge Condition: Stable Ambulatory Status: Wheelchair Discharge Destination: Home Transportation: Private Auto Accompanied By: self Schedule Follow-up Appointment:  Yes Medication Reconciliation completed and provided to Patient/Care No Penny Clements: Provided on Clinical Summary of Care: 11/09/2014 Form Type Recipient Paper Patient [facility] Signature(s) Signed: 11/09/2014 5:16:10 PM By: Montey Hora Previous Signature: 11/09/2014 2:52:25 PM Version By: Ruthine Dose Entered By: Montey Hora on 11/09/2014 15:09:43 Penny Clements ([id number]) -------------------------------------------------------------------------------- Lower Extremity Assessment Details Patient Name: Penny Clements Date of Service: 11/09/2014 1:45 PM Medical Record Number: [id number] Patient Account Number: [id number] Date of Birth/Sex: 25-Feb-1928 (81 y.o. Female) Treating RN: Montey Hora Primary Care Physician: Deborra Medina Other Clinician: Referring Physician: Deborra Medina Treating Physician/Extender: Suella Grove in Treatment: 30 Edema Assessment Assessed: Shirlyn Goltz: No] [Right: No] E[Left: dema] [Right: :] Calf Left: Right: Point of Measurement: 33 cm From Medial Instep 26.5 cm cm Ankle Left: Right: Point of Measurement: 10 cm From Medial Instep 18.5 cm cm Vascular Assessment Pulses: Posterior Tibial Palpable: [Left:Yes] Dorsalis Pedis Palpable: [Left:Yes] Extremity colors, hair growth, and conditions: Extremity Color: [Left:Hyperpigmented] Hair Growth on Extremity: [Left:No] Temperature of Extremity: [Left:Warm] Capillary Refill: [Left:< 3 seconds] Toe Nail Assessment Left: Right: Thick: Yes Discolored: Yes Deformed: Yes Improper Length and Hygiene: No Electronic Signature(s) Signed: 11/09/2014 5:16:10 PM By: Montey Hora Entered By: Montey Hora on 11/09/2014 14:03:43 Penny Clements ([name] ([id number]) -------------------------------------------------------------------------------- Multi Wound Chart Details Patient Name: Penny Clements Date of Service: 11/09/2014 1:45 PM Medical Record Number:  [id number] Patient Account Number: [id number] Date of Birth/Sex: [birth year] (81 y.o. 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] [N/A:N/A] Comorbid History: [7:Cataracts, Anemia, Type I N/A Diabetes, Type II Diabetes, Neuropathy] [N/A:N/A] Date Acquired: [7:09/20/2014] [8:10/26/2014] [N/A:N/A] Weeks of Treatment: [7:7] [8:2] [N/A:N/A] Wound Status: [7:Open] [8:Healed -  Epithelialized] [N/A:N/A] Measurements L x W x D 3.1x1.1x0.1 [8:0x0x0] [N/A:N/A] (cm) Area (cm) : [7:2.678] [8:0] [N/A:N/A] Volume (cm) : [7:0.268] [8:0] [N/A:N/A] % Reduction in Area: [7:-468.60%] [8:100.00%] [N/A:N/A] % Reduction in Volume: -185.10% [8:100.00%] [N/A:N/A] Classification: [7:Full Thickness Without Exposed Support Structures] [8:Partial Thickness] [N/A:N/A] HBO Classification: [7:Grade 1] [8:N/A] [N/A:N/A] Exudate Amount: [7:Small] [8:N/A] [N/A:N/A] Exudate Type: [7:Serous] [8:N/A] [N/A:N/A] Exudate Color: [7:amber] [8:N/A] [N/A:N/A] Wound Margin: [7:Distinct, outline attached N/A] [N/A:N/A] Granulation Amount: [7:Medium (34-66%)] [8:N/A] [N/A:N/A] Granulation Quality: [7:Pink, Pale] [8:N/A] [N/A:N/A] Necrotic Amount: [7:Medium (34-66%)] [8:N/A] [N/A:N/A] Exposed Structures: [7:Fascia: No Fat: No Tendon: No] [8:N/A] [N/A:N/A] Muscle: No Joint: No Bone: No Limited to Skin Breakdown Epithelialization: Small (1-33%)  N/A N/A Debridement: Debridement (41740- N/A N/A 11047) Time-Out Taken: Yes N/A N/A Pain Control: Lidocaine 4% Topical N/A N/A Solution Tissue Debrided: Necrotic/Eschar, N/A N/A Fibrin/Slough, Exudates, Subcutaneous Level: Skin/Subcutaneous N/A N/A Tissue Debridement Area (sq 3.41 N/A N/A cm): Instrument: Curette N/A N/A Bleeding: Moderate N/A N/A Hemostasis Achieved: Silver Nitrate N/A N/A Procedural Pain: 0 N/A N/A Post Procedural Pain: 0 N/A N/A Debridement Treatment Procedure was tolerated N/A N/A Response: well Post Debridement 3.1x1.1x0.1 N/A N/A Measurements L x W x D (cm) Post Debridement 0.268 N/A N/A Volume: (cm) Periwound Skin Texture: Edema: No No Abnormalities Noted N/A Excoriation: No Induration: No Callus: No Crepitus: No Fluctuance: No Friable: No Rash: No Scarring: No Periwound Skin Moist: Yes No Abnormalities Noted N/A Moisture: Dry/Scaly: Yes Maceration: No Periwound Skin Color: Erythema: Yes No Abnormalities Noted N/A Atrophie Blanche: No Cyanosis: No Ecchymosis: No Hemosiderin Staining: No Mottled: No Pallor: No Rubor: No Penny Clements, Penny T. ([id number]) Temperature: No Abnormality N/A N/A Tenderness on No No N/A Palpation: Wound Preparation: Ulcer Cleansing: N/A N/A Rinsed/Irrigated with Saline, Other: soap and water Topical Anesthetic Applied: Other: lidocaine 4% Procedures Performed: Cellular or Tissue Based N/A N/A Product Debridement Treatment Notes Electronic Signature(s) Signed: 11/09/2014 5:13:49 PM By: Regan Lemming BSN, RN Entered By: Regan Lemming on 11/09/2014 14:33:49 Penny Clements ([id number]) -------------------------------------------------------------------------------- [place] Details Patient Name: Penny Clements Date of Service: 11/09/2014 1:45 PM Medical Record Number: [id number] Patient Account Number: [id number] Date of Birth/Sex: May 10, 1928 (81 y.o. Female) Treating RN: Afful,  RN, BSN, Velva Harman Primary Care Physician: Deborra Medina Other Clinician: Referring Physician: Deborra Medina Treating Physician/Extender: BURNS, Charlean Sanfilippo in Treatment: 80 Active Inactive Abuse / Safety / Falls / Self Care Management Nursing Diagnoses: Potential for falls Goals: Patient will remain injury free Date Initiated: 04/07/2014 Goal Status: Active Interventions: Assess fall risk on admission and as needed Notes: Nutrition Nursing Diagnoses: Impaired glucose control: actual or potential Goals: Patient/caregiver agrees to and verbalizes understanding of need to use nutritional supplements and/or vitamins as prescribed Date Initiated: 04/07/2014 Goal Status: Active Interventions: Assess patient nutrition upon admission and as needed per policy Provide education on nutrition Notes: Orientation to the Wound Care Program Nursing Diagnoses: Knowledge deficit related to the wound healing center program Goals: CYENNA, REBELLO ([id number]) Patient/caregiver will verbalize understanding of the [facility] Date Initiated: 04/07/2014 Goal Status: Active Interventions: Provide education on orientation to the wound center Notes: Wound/Skin Impairment Nursing Diagnoses: Impaired tissue integrity Goals: Patient/caregiver will verbalize understanding of skin care regimen Date Initiated: 04/07/2014 Goal Status: Active Ulcer/skin breakdown will heal within 14 weeks Date Initiated: 04/07/2014 Goal Status: Active Interventions: Assess patient/caregiver ability to obtain necessary supplies Notes: Electronic Signature(s) Signed: 11/09/2014 5:13:49 PM By: Regan Lemming BSN, RN Entered By: Regan Lemming on 11/09/2014 14:33:41 Penny Clements ([id number]) --------------------------------------------------------------------------------  Patient/Caregiver Education Details Patient Name: Penny Clements, Penny Clements Date of Service: 11/09/2014 1:45 PM Medical Record  Number: [id number] Patient Account Number: [id number] Date of Birth/Gender: 05/23/1928 (81 y.o. Female) Treating RN: Montey Hora Primary Care Physician: Deborra Medina Other Clinician: Referring Physician: Deborra Medina Treating Physician/Extender: BURNS, Charlean Sanfilippo in Treatment: 30 Education Assessment Education Provided To: Patient Education Topics Provided Wound/Skin Impairment: Handouts: Caring for Your Ulcer Methods: Demonstration, Explain/Verbal Responses: State content correctly Electronic Signature(s) Signed: 11/09/2014 5:16:10 PM By: Montey Hora Entered By: Montey Hora on 11/09/2014 15:09:56 Penny Clements ([id number]) -------------------------------------------------------------------------------- Wound Assessment Details Patient Name: Penny Clements Date of Service: 11/09/2014 1:45 PM Medical Record Number: [id number] Patient Account Number: [id number] Date of Birth/Sex: [birth year] (81 y.o. Female) Treating RN: Montey Hora Primary Care Physician: Deborra Medina Other Clinician: Referring Physician: Deborra Medina Treating Physician/Extender: Suella Grove in Treatment: 30 Wound Status Wound Number: 7 Primary Arterial Insufficiency Ulcer Etiology: Wound Location: Left Foot - Dorsal Wound Open Wounding Event: Gradually Appeared Status: Date Acquired: 09/20/2014 Comorbid Cataracts, Anemia, Type I Diabetes, Weeks Of Treatment: 7 History: Type II Diabetes, Neuropathy Clustered Wound: No Photos Photo Uploaded By: Montey Hora on 11/09/2014 17:14:14 Wound Measurements Length: (cm) 3.1 Width: (cm) 1.1 Depth: (cm) 0.1 Area: (cm) 2.678 Volume: (cm) 0.268 % Reduction in Area: -468.6% % Reduction in Volume: -185.1% Epithelialization: Small (1-33%) Tunneling: No Undermining: No Wound Description Full Thickness Without Foul Odor Aft Classification: Exposed Support Structures Diabetic Severity Grade 1 (Wagner): Wound Margin: Distinct, outline  attached Exudate Amount: Small Exudate Type: Serous Exudate Color: amber er Cleansing: No Wound Bed Granulation Amount: Medium (34-66%) Exposed Structure Granulation Quality: Pink, Pale Fascia Exposed: No Penny Clements, Penny T. ([id number]) Necrotic Amount: Medium (34-66%) Fat Layer Exposed: No Necrotic Quality: Adherent Slough Tendon Exposed: No Muscle Exposed: No Joint Exposed: No Bone Exposed: No Limited to Skin Breakdown Periwound Skin Texture Texture Color No Abnormalities Noted: No No Abnormalities Noted: No Callus: No Atrophie Blanche: No Crepitus: No Cyanosis: No Excoriation: No Ecchymosis: No Fluctuance: No Erythema: Yes Friable: No Hemosiderin Staining: No Induration: No Mottled: No Localized Edema: No Pallor: No Rash: No Rubor: No Scarring: No Temperature / Pain Moisture Temperature: No Abnormality No Abnormalities Noted: No Dry / Scaly: Yes Maceration: No Moist: Yes Wound Preparation Ulcer Cleansing: Rinsed/Irrigated with Saline, Other: soap and water, Topical Anesthetic Applied: Other: lidocaine 4%, Treatment Notes Wound #7 (Left, Dorsal Foot) 1. Cleansed with: Cleanse wound with antibacterial soap and water 2. Anesthetic Topical Lidocaine 4% cream to wound bed prior to debridement 4. Dressing Applied: Other dressing (specify in notes) 5. Secondary Dressing Applied Non-Adherent pad 7. Secured with Tape 4-Layer Compression System - Left Lower Extremity Notes epifix applied by MD today Electronic Signature(s) Penny Clements, Penny Clements ([id number]) Signed: 11/09/2014 5:16:10 PM By: Montey Hora Entered By: Montey Hora on 11/09/2014 14:01:54 Penny Clements ([id number]) -------------------------------------------------------------------------------- Wound Assessment Details Patient Name: Penny Clements Date of Service: 11/09/2014 1:45 PM Medical Record Number: [id number] Patient Account Number: [id number] Date of Birth/Sex: July 07, 1927  (81 y.o. Female) Treating RN: Baruch Gouty, RN, BSN, Velva Harman Primary Care Physician: Deborra Medina Other Clinician: Referring Physician: Deborra Medina Treating Physician/Extender: BURNS, Charlean Sanfilippo in Treatment: 30 Wound Status Wound Number: 8 Primary Etiology: Vasculitis Wound Location: Left, Distal, Dorsal Foot Wound Status: Healed - Epithelialized Wounding Event: Gradually Appeared Date Acquired: 10/26/2014 Weeks Of Treatment: 2 Clustered Wound: No Photos Photo Uploaded By: Montey Hora on 11/09/2014 17:14:15 Wound Measurements Length: (cm) 0 % Reductio Width: (cm) 0 % Reductio Depth: (cm) 0 Area: (  cm) 0 Volume: (cm) 0 n in Area: 100% n in Volume: 100% Wound Description Classification: Partial Thickness Periwound Skin Texture Texture Color No Abnormalities Noted: No No Abnormalities Noted: No Moisture No Abnormalities Noted: No Electronic Signature(s) Signed: 11/09/2014 5:13:49 PM By: Regan Lemming BSN, RN [place], Colon Flattery ([id number]) Entered By: Regan Lemming on 11/09/2014 14:27:36 Penny Clements ([id number]) -------------------------------------------------------------------------------- Vitals Details Patient Name: Penny Clements Date of Service: 11/09/2014 1:45 PM Medical Record Number: [id number] Patient Account Number: [id number] Date of Birth/Sex: [birth year] (81 y.o. Female) Treating RN: Montey Hora Primary Care Physician: Deborra Medina Other Clinician: Referring Physician: Deborra Medina Treating Physician/Extender: Suella Grove in Treatment: 30 Vital Signs Time Taken: 13:45 Temperature (F): 97.6 Height (in): 63 Pulse (bpm): 107 Weight (lbs): 92 Respiratory Rate (breaths/min): 18 Body Mass Index (BMI): 16.3 Blood Pressure (mmHg): 118/88 Reference Range: 80 - 120 mg / dl Electronic Signature(s) Signed: 11/09/2014 5:16:10 PM By: Montey Hora Entered By: Montey Hora on 11/09/2014 13:46:36

## 2014-11-16 ENCOUNTER — Encounter: Payer: Medicare Other | Admitting: Surgery

## 2014-11-16 DIAGNOSIS — L97529 Non-pressure chronic ulcer of other part of left foot with unspecified severity: Secondary | ICD-10-CM | POA: Diagnosis not present

## 2014-11-16 NOTE — Progress Notes (Addendum)
Penny, Clements ([id number]) Visit Report for 11/16/2014 Chief Complaint Document Details Patient Name: Penny, Clements Date of Service: 11/16/2014 1:45 PM Medical Record Number: [id number] Patient Account Number: [id number] Date of Birth/Sex: 06-23-1928 (81 y.o. Female) Treating RN: Primary Care Physician: Deborra Medina Other Clinician: Referring Physician: Deborra Medina Treating Physician/Extender: BURNS III, Charlean Sanfilippo in Treatment: 31 Information Obtained from: Patient Chief Complaint Recurrent left lateral foot ulcers. Electronic Signature(s) Signed: 11/16/2014 4:38:40 PM By: Loletha Grayer MD Entered By: Loletha Grayer on 11/16/2014 15:26:58 Penny Clements ([id number]) -------------------------------------------------------------------------------- Debridement Details Patient Name: Penny Clements Date of Service: 11/16/2014 1:45 PM Medical Record Number: [id number] Patient Account Number: [id number] Date of Birth/Sex: 30-Jul-1927 (81 y.o. Female) Treating RN: Primary Care Physician: Deborra Medina Other Clinician: Referring Physician: Deborra Medina Treating Physician/Extender: BURNS III, Charlean Sanfilippo in Treatment: 31 Debridement Performed for Wound #7 Left,Dorsal Foot Assessment: Performed By: Physician BURNS III, Arless Vineyard, Debridement: Debridement Pre-procedure Yes Verification/Time Out Taken: Start Time: 14:20 Pain Control: Lidocaine 4% Topical Solution Level: Skin/Subcutaneous Tissue Total Area Debrided (L x 3.4 (cm) x 1 (cm) = 3.4 (cm) W): Tissue and other Viable, Non-Viable, Exudate, Fat, Fibrin/Slough, Subcutaneous material debrided: Instrument: Curette, Scissors Bleeding: Moderate Hemostasis Achieved: Silver Nitrate End Time: 14:21 Procedural Pain: 0 Post Procedural Pain: 0 Response to Treatment: Procedure was tolerated well Post Debridement Measurements of Total Wound Length: (cm) 3.4 Width: (cm) 1 Depth: (cm) 0.2 Volume: (cm)  0.534 Electronic Signature(s) Signed: 11/16/2014 4:38:40 PM By: Loletha Grayer MD Entered By: Loletha Grayer on 11/16/2014 15:26:41 Penny Clements ([id number]) -------------------------------------------------------------------------------- HPI Details Patient Name: Penny Clements Date of Service: 11/16/2014 1:45 PM Medical Record Number: [id number] Patient Account Number: [id number] Date of Birth/Sex: 10-Oct-1927 (81 y.o. Female) Treating RN: Primary Care Physician: Deborra Medina Other Clinician: Referring Physician: Deborra Medina Treating Physician/Extender: BURNS III, Charlean Sanfilippo in Treatment: 1 History of Present Illness HPI Description: Very pleasant 81 year old with a past medical history significant for type 2 diabetes and lung cancer (status post chemoradiation). She has had a recurrent left diabetic foot ulcer for almost 10 years, which has healed in the past with compression therapy. This recently recurred in October 2015. No history of trauma. No significant pain. No symptoms to indicate ischemic rest pain or claudication. ABI 1.11. Ambulating per her baseline with a walker. Culture on 05/18/2014 grew methicillin sensitive staph aureus and staph lugdunensis, both sensitive to tetracycline. She was started on doxycycline but developed severe itching. She does not want anymore antibiotics. Punch biopsy on 06/29/2014 showed no evidence for malignancy. Consistent with stasis dermatitis. Underwent normal CVI ultrasound at [facility] in March 2016. Original ulcer has healed. She developed a second adjacent ulceration dorsally. Treating with regular debridements and 4 layer compression. Improved with epifix x8 (initially applied on 07/06/2014). She returns to clinic for follow-up and is without new complaints. No trauma. No significant pain. No fever or chills. No significant drainage. Electronic Signature(s) Signed: 11/16/2014 4:38:40 PM By: Loletha Grayer MD Entered By: Loletha Grayer on 11/16/2014 15:27:20 Penny Clements ([id number]) -------------------------------------------------------------------------------- Physical Exam Details Patient Name: Penny Clements Date of Service: 11/16/2014 1:45 PM Medical Record Number: [id number] Patient Account Number: [id number] Date of Birth/Sex: [birth year] (81 y.o. Female) Treating RN: Primary Care Physician: Deborra Medina Other Clinician: Referring Physician: Deborra Medina Treating Physician/Extender: BURNS III, Terris Germano Weeks in Treatment: 31 Constitutional . Pulse regular. Respirations normal and unlabored. Afebrile. . Notes Original left lateral foot ulceration is completely re-epithelialized.  No drainage. No infection. More recent left dorsal foot ulceration is slightly improved. No active infection. Edema well controlled. Palpable DP. Electronic Signature(s) Signed: 11/16/2014 4:38:40 PM By: Loletha Grayer MD Entered By: Loletha Grayer on 11/16/2014 15:27:57 Penny Clements ([id number]) -------------------------------------------------------------------------------- Physician Orders Details Patient Name: Penny Clements Date of Service: 11/16/2014 1:45 PM Medical Record Number: [id number] Patient Account Number: [id number] Date of Birth/Sex: [birth year] (81 y.o. Female) Treating RN: Baruch Gouty, RN, BSN, Velva Harman Primary Care Physician: Deborra Medina Other Clinician: Referring Physician: Deborra Medina Treating Physician/Extender: BURNS III, Charlean Sanfilippo in Treatment: 23 Verbal / Phone Orders: Yes Clinician: Afful, RN, BSN, Rita Read Back and Verified: Yes Diagnosis Coding Wound Cleansing Wound #7 Left,Dorsal Foot o May shower with protection. o No tub bath. Skin Barriers/Peri-Wound Care Wound #7 Left,Dorsal Foot o Other: - Nystatin powder Primary Wound Dressing Wound #7 Left,Dorsal Foot o Prisma Ag Secondary Dressing Wound #7 Left,Dorsal  Foot o ABD pad Dressing Change Frequency Wound #7 Left,Dorsal Foot o Change dressing every week Follow-up Appointments Wound #7 Left,Dorsal Foot o Return Appointment in 1 week. Edema Control Wound #7 Left,Dorsal Foot o 4-Layer Compression System - Left Lower Extremity Notes Order #9 epifix Electronic Signature(s) Signed: 11/16/2014 2:22:02 PM By: Regan Lemming BSN, RN [place], Dellar Clements. ([id number]) Entered By: Regan Lemming on 11/16/2014 14:22:01 Penny Clements ([id number]) -------------------------------------------------------------------------------- Problem List Details Patient Name: Penny Clements Date of Service: 11/16/2014 1:45 PM Medical Record Number: [id number] Patient Account Number: [id number] Date of Birth/Sex: 08/07/1927 (81 y.o. Female) Treating RN: Primary Care Physician: Deborra Medina Other Clinician: Referring Physician: Deborra Medina Treating Physician/Extender: BURNS III, Charlean Sanfilippo in Treatment: 31 Active Problems ICD-10 Encounter Code Description Active Date Diagnosis E11.621 Type 2 diabetes mellitus with foot ulcer 04/07/2014 Yes E11.40 Type 2 diabetes mellitus with diabetic neuropathy, 04/27/2014 Yes unspecified I83.223 Varicose veins of left lower extremity with both ulcer of 07/06/2014 Yes ankle and inflammation Inactive Problems ICD-10 Code Description Active Date Inactive Date L03.116 Cellulitis of left lower limb 05/18/2014 05/25/2014 Resolved Problems Electronic Signature(s) Signed: 11/16/2014 4:38:40 PM By: Loletha Grayer MD Entered By: Loletha Grayer on 11/16/2014 15:26:08 Penny Clements ([id number]) -------------------------------------------------------------------------------- Progress Note Details Patient Name: Penny Clements Date of Service: 11/16/2014 1:45 PM Medical Record Number: [id number] Patient Account Number: [id number] Date of Birth/Sex: 1927/07/31 (81 y.o. Female) Treating RN: Primary Care  Physician: Deborra Medina Other Clinician: Referring Physician: Deborra Medina Treating Physician/Extender: BURNS III, Charlean Sanfilippo in Treatment: 31 Subjective Chief Complaint Information obtained from Patient Recurrent left lateral foot ulcers. History of Present Illness (HPI) Very pleasant 81 year old with a past medical history significant for type 2 diabetes and lung cancer (status post chemoradiation). She has had a recurrent left diabetic foot ulcer for almost 10 years, which has healed in the past with compression therapy. This recently recurred in October 2015. No history of trauma. No significant pain. No symptoms to indicate ischemic rest pain or claudication. ABI 1.11. Ambulating per her baseline with a walker. Culture on 05/18/2014 grew methicillin sensitive staph aureus and staph lugdunensis, both sensitive to tetracycline. She was started on doxycycline but developed severe itching. She does not want anymore antibiotics. Punch biopsy on 06/29/2014 showed no evidence for malignancy. Consistent with stasis dermatitis. Underwent normal CVI ultrasound at [facility] in March 2016. Original ulcer has healed. She developed a second adjacent ulceration dorsally. Treating with regular debridements and 4 layer compression. Improved with epifix x8 (initially applied on 07/06/2014). She returns to clinic  for follow-up and is without new complaints. No trauma. No significant pain. No fever or chills. No significant drainage. Objective Constitutional Pulse regular. Respirations normal and unlabored. Afebrile. Vitals Time Taken: 1:55 PM, Height: 63 in, Weight: 92 lbs, BMI: 16.3, Temperature: 97.9 F, Pulse: 106 bpm, Respiratory Rate: 18 breaths/min, Blood Pressure: 119/58 mmHg. Penny Clements, Penny TMarland Kitchen ([id number]) General Notes: Original left lateral foot ulceration is completely re-epithelialized. No drainage. No infection. More recent left dorsal foot ulceration is slightly  improved. No active infection. Edema well controlled. Palpable DP. Integumentary (Hair, Skin) Wound #7 status is Open. Original cause of wound was Gradually Appeared. The wound is located on the Left,Dorsal Foot. The wound measures 3.4cm length x 1cm width x 0.1cm depth; 2.67cm^2 area and 0.267cm^3 volume. The wound is limited to skin breakdown. There is no tunneling or undermining noted. There is a small amount of serous drainage noted. The wound margin is distinct with the outline attached to the wound base. There is medium (34-66%) pink, pale granulation within the wound bed. There is a medium (34-66%) amount of necrotic tissue within the wound bed including Adherent Slough. The periwound skin appearance exhibited: Dry/Scaly, Moist, Erythema. The periwound skin appearance did not exhibit: Callus, Crepitus, Excoriation, Fluctuance, Friable, Induration, Localized Edema, Rash, Scarring, Maceration, Atrophie Blanche, Cyanosis, Ecchymosis, Hemosiderin Staining, Mottled, Pallor, Rubor. The surrounding wound skin color is noted with erythema. Periwound temperature was noted as No Abnormality. 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 There was a Skin/Subcutaneous Tissue Debridement ([id number]) debridement with total area of 3.4 sq cm performed by BURNS III, Hye Trawick. with the following instrument(s): Curette and Scissors to remove Viable and Non- Viable tissue/material including Exudate, Fat, Fibrin/Slough, and Subcutaneous after achieving pain control using Lidocaine 4% Topical Solution. A time out was conducted prior to the start of the  procedure. A Moderate amount of bleeding was controlled with Silver Nitrate. The procedure was tolerated well with a pain level of 0 throughout and a pain level of 0 following the procedure. Post Debridement Measurements: 3.4cm length x 1cm width x 0.2cm depth; 0.534cm^3 volume. Penny Clements, Penny Clements ([id number]) Plan Wound Cleansing: Wound #7 Left,Dorsal Foot: May shower with protection. No tub bath. Skin Barriers/Peri-Wound Care: Wound #7 Left,Dorsal Foot: Other: - Nystatin powder Primary Wound Dressing: Wound #7 Left,Dorsal Foot: Prisma Ag Secondary Dressing: Wound #7 Left,Dorsal Foot: ABD pad Dressing Change Frequency: Wound #7 Left,Dorsal Foot: Change dressing every week Follow-up Appointments: Wound #7 Left,Dorsal Foot: Return Appointment in 1 week. Edema Control: Wound #7 Left,Dorsal Foot: 4-Layer Compression System - Left Lower Extremity General Notes: Order #9 epifix Prisma. 4 layer compression. Order epifix #9. Electronic Signature(s) Signed: 11/16/2014 4:38:40 PM By: Loletha Grayer MD Entered By: Loletha Grayer on 11/16/2014 15:29:02 Penny Clements ([id number]) -------------------------------------------------------------------------------- SuperBill Details Patient Name: Penny Clements Date of Service: 11/16/2014 Medical Record Number: [id number] Patient Account Number: [id number] Date of Birth/Sex: [birth year] (81 y.o. 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

## 2014-11-17 NOTE — Progress Notes (Signed)
Penny, Clements ([id number]) Visit Report for 11/16/2014 Arrival Information Details Patient Name: Penny, Clements Date of Service: 11/16/2014 1:45 PM Medical Record Number: [id number] Patient Account Number: [id number] Date of Birth/Sex: [birth year] (81 y.o. Female) Treating RN: Montey Hora Primary Care Physician: Deborra Medina Other Clinician: Referring Physician: Deborra Medina Treating Physician/Extender: BURNS III, Charlean Sanfilippo in Treatment: 38 Visit Information History Since Last Visit Added or deleted any medications: No Patient Arrived: [place] Any new allergies or adverse reactions: No Arrival Time: 13:52 Had a fall or experienced change in No Accompanied By: self activities of daily living that may affect Transfer Assistance: None risk of falls: Patient Identification Verified: Yes Signs or symptoms of abuse/neglect since last No Secondary Verification Process Completed: Yes visito Patient Requires Transmission-Based No Hospitalized since last visit: No Precautions: Pain Present Now: No Patient Has Alerts: Yes Electronic Signature(s) Signed: 11/16/2014 5:42:15 PM By: Montey Hora Entered By: Montey Hora on 11/16/2014 13:53:05 Penny Clements ([id number]) -------------------------------------------------------------------------------- Encounter Discharge Information Details Patient Name: Penny Clements Date of Service: 11/16/2014 1:45 PM Medical Record Number: [id number] Patient Account Number: [id number] Date of Birth/Sex: 05/04/1928 (81 y.o. Female) Treating RN: Primary Care Physician: Deborra Medina Other Clinician: Referring Physician: Deborra Medina Treating Physician/Extender: BURNS III, Charlean Sanfilippo in Treatment: 31 Encounter Discharge Information Items Schedule Follow-up Appointment: No Medication Reconciliation completed No and provided to Patient/Care Penny Clements: Provided on Clinical Summary of Care: 11/16/2014 Form Type Recipient Paper  Patient [place] Signature(s) Signed: 11/16/2014 2:45:24 PM By: Ruthine Dose Entered By: Ruthine Dose on 11/16/2014 14:45:24 Penny Clements ([id number]) -------------------------------------------------------------------------------- Lower Extremity Assessment Details Patient Name: Penny Clements Date of Service: 11/16/2014 1:45 PM Medical Record Number: [id number] Patient Account Number: [id number] Date of Birth/Sex: [birth year] (81 y.o. Female) Treating RN: Montey Hora Primary Care Physician: Deborra Medina Other Clinician: Referring Physician: Deborra Medina Treating Physician/Extender: BURNS III, Charlean Sanfilippo in Treatment: 31 Edema Assessment Assessed: [Left: No] [Right: No] E[Left: dema] [Right: :] Calf Left: Right: Point of Measurement: 33 cm From Medial Instep 26.4 cm cm Ankle Left: Right: Point of Measurement: 10 cm From Medial Instep 19 cm cm Vascular Assessment Pulses: Posterior Tibial Palpable: [Left:Yes] Dorsalis Pedis Palpable: [Left:Yes] Extremity colors, hair growth, and conditions: Extremity Color: [Left:Hyperpigmented] Hair Growth on Extremity: [Left:No] Temperature of Extremity: [Left:Warm] Capillary Refill: [Left:< 3 seconds] Toe Nail Assessment Left: Right: Thick: Yes Discolored: Yes Deformed: No Improper Length and Hygiene: No Electronic Signature(s) Signed: 11/16/2014 5:42:15 PM By: Montey Hora Entered By: Montey Hora on 11/16/2014 14:01:31 Penny Clements ([name] ([id number]) -------------------------------------------------------------------------------- Multi Wound Chart Details Patient Name: Penny Clements Date of Service: 11/16/2014 1:45 PM Medical Record Number: [id number] Patient Account Number: [id number] Date of Birth/Sex: August 12, 1927 (81 y.o. 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] Comorbid History: [7:Cataracts, Anemia, Type I N/A Diabetes, Type II Diabetes, Neuropathy] Date Acquired: [7:09/20/2014] [N/A:N/A] Weeks of Treatment: [7:8] [N/A:N/A] Wound Status: [7:Open] [N/A:N/A] Measurements L x W x D 3.4x1x0.1 [N/A:N/A] (cm) Area (cm) : [7:2.67] [N/A:N/A] Volume (cm) : [7:0.267] [N/A:N/A] % Reduction in Area: [7:-466.90%] [N/A:N/A] % Reduction in Volume: -184.00% [N/A:N/A] Classification: [7:Full Thickness Without  Exposed Support Structures] [N/A:N/A] HBO Classification: [7:Grade 1] [N/A:N/A] Exudate Amount: [7:Small] [N/A:N/A] Exudate Type: [7:Serous] [N/A:N/A] Exudate Color: [7:amber] [N/A:N/A] Wound Margin: [7:Distinct, outline attached N/A] Granulation Amount: [7:Medium (34-66%)] [N/A:N/A] Granulation Quality: [7:Pink, Pale] [N/A:N/A] Necrotic Amount: [7:Medium (34-66%)] [N/A:N/A] Exposed Structures: [7:Fascia: No Fat: No Tendon: No] [N/A:N/A] Muscle: No Joint: No Bone: No Limited to Skin Breakdown Epithelialization: Small (1-33%) N/A N/A Periwound Skin Texture: Edema: No N/A N/A Excoriation: No Induration: No Callus: No Crepitus: No Fluctuance: No Friable: No Rash: No Scarring: No Periwound Skin Moist: Yes N/A N/A Moisture: Dry/Scaly: Yes Maceration: No Periwound Skin Color: Erythema: Yes N/A N/A Atrophie Blanche: No Cyanosis: No Ecchymosis: No Hemosiderin Staining: No Mottled: No Pallor: No Rubor: No Temperature: No Abnormality N/A N/A Tenderness on No N/A N/A Palpation: Wound Preparation: Ulcer Cleansing:  Other: N/A N/A soap and water Topical Anesthetic Applied: Other: lidocaine 4% Treatment Notes Electronic Signature(s) Signed: 11/16/2014 5:38:29 PM By: Regan Lemming BSN, RN Entered By: Regan Lemming on 11/16/2014 14:18:42 Penny Clements ([id number]) -------------------------------------------------------------------------------- Multi-Disciplinary Care Plan Details Patient Name: Penny Clements Date of Service: 11/16/2014 1:45 PM Medical Record Number: [id number] Patient Account Number: [id number] Date of Birth/Sex: [birth year] (81 y.o. Female) Treating RN: Baruch Gouty, RN, BSN, Velva Harman Primary Care Physician: Deborra Medina Other Clinician: Referring Physician: Deborra Medina Treating Physician/Extender: BURNS III, Charlean Sanfilippo in Treatment: 21 Active Inactive Abuse / Safety / Falls / Self Care Management Nursing Diagnoses: Potential for falls Goals: Patient will remain injury free Date Initiated: 04/07/2014 Goal Status: Active Interventions: Assess fall risk on admission and as needed Notes: Nutrition Nursing Diagnoses: Impaired glucose control: actual or potential Goals: Patient/caregiver agrees to and verbalizes understanding of need to use nutritional supplements and/or vitamins as prescribed Date Initiated: 04/07/2014 Goal Status: Active Interventions: Assess patient nutrition upon admission and as needed per policy Provide education on nutrition Notes: Orientation to the Wound Care Program Nursing Diagnoses: Knowledge deficit related to the wound healing center program Goals: JAMIESHA, VICTORIA ([id number]) Patient/caregiver will verbalize understanding of the [place] Date Initiated: 04/07/2014 Goal Status: Active Interventions: Provide education on orientation to the wound center Notes: Wound/Skin Impairment Nursing Diagnoses: Impaired tissue integrity Goals: Patient/caregiver will verbalize understanding of skin care regimen Date  Initiated: 04/07/2014 Goal Status: Active Ulcer/skin breakdown will heal within 14 weeks Date Initiated: 04/07/2014 Goal Status: Active Interventions: Assess patient/caregiver ability to obtain necessary supplies Notes: Electronic Signature(s) Signed: 11/16/2014 5:38:29 PM By: Regan Lemming BSN, RN Entered By: Regan Lemming on 11/16/2014 14:18:28 Penny Clements ([id number]) -------------------------------------------------------------------------------- Wound Assessment Details Patient Name: Penny Clements Date of Service: 11/16/2014 1:45 PM Medical Record Number: [id number] Patient Account Number: [id number] Date of Birth/Sex: 1927/09/08 (81 y.o. Female) Treating RN: Montey Hora Primary Care Physician: Deborra Medina Other Clinician: Referring Physician: Deborra Medina Treating Physician/Extender: BURNS III, Charlean Sanfilippo in Treatment: 31 Wound Status Wound Number: 7 Primary Arterial Insufficiency Ulcer Etiology: Wound Location: Left Foot - Dorsal Wound Open Wounding Event: Gradually Appeared Status: Date Acquired: 09/20/2014 Comorbid Cataracts, Anemia, Type I Diabetes, Weeks Of Treatment: 8 History: Type II Diabetes, Neuropathy Clustered Wound: No Photos Photo Uploaded By: Montey Hora on 11/16/2014 17:26:45 Wound Measurements Length: (cm) 3.4 Width: (cm) 1 Depth: (cm) 0.1 Area: (cm) 2.67 Volume: (cm) 0.267 % Reduction in Area: -466.9% % Reduction in Volume: -184% Epithelialization: Small (1-33%) Tunneling: No Undermining: No Wound Description Full Thickness Without Foul Odor Aft Classification: Exposed Support Structures Diabetic Severity Grade 1 (Wagner): Wound Margin: Distinct, outline attached Exudate Amount: Small Exudate  Type: Serous Exudate Color: amber er Cleansing: No Wound Bed Granulation Amount: Medium (34-66%) Exposed Structure Granulation Quality: Pink, Pale Fascia Exposed: No Frankland, Jola T. ([id number]) Necrotic Amount: Medium  (34-66%) Fat Layer Exposed: No Necrotic Quality: Adherent Slough Tendon Exposed: No Muscle Exposed: No Joint Exposed: No Bone Exposed: No Limited to Skin Breakdown Periwound Skin Texture Texture Color No Abnormalities Noted: No No Abnormalities Noted: No Callus: No Atrophie Blanche: No Crepitus: No Cyanosis: No Excoriation: No Ecchymosis: No Fluctuance: No Erythema: Yes Friable: No Hemosiderin Staining: No Induration: No Mottled: No Localized Edema: No Pallor: No Rash: No Rubor: No Scarring: No Temperature / Pain Moisture Temperature: No Abnormality No Abnormalities Noted: No Dry / Scaly: Yes Maceration: No Moist: Yes Wound Preparation Ulcer Cleansing: Other: soap and water, Topical Anesthetic Applied: Other: lidocaine 4%, Electronic Signature(s) Signed: 11/16/2014 5:42:15 PM By: Montey Hora Entered By: Montey Hora on 11/16/2014 14:12:04 Penny Clements ([id number]) -------------------------------------------------------------------------------- [place] Details Patient Name: Penny Clements Date of Service: 11/16/2014 1:45 PM Medical Record Number: [id number] Patient Account Number: [id number] Date of Birth/Sex: 08-Nov-1927 (81 y.o. Female) Treating RN: Montey Hora Primary Care Physician: Deborra Medina Other Clinician: Referring Physician: Deborra Medina Treating Physician/Extender: BURNS III, Charlean Sanfilippo in Treatment: 31 Vital Signs Time Taken: 13:55 Temperature (F): 97.9 Height (in): 63 Pulse (bpm): 106 Weight (lbs): 92 Respiratory Rate (breaths/min): 18 Body Mass Index (BMI): 16.3 Blood Pressure (mmHg): 119/58 Reference Range: 80 - 120 mg / dl Electronic Signature(s) Signed: 11/16/2014 5:42:15 PM By: Montey Hora Entered By: Montey Hora on 11/16/2014 13:54:52

## 2014-11-23 ENCOUNTER — Encounter: Payer: Medicare Other | Admitting: Surgery

## 2014-11-30 ENCOUNTER — Encounter: Payer: Medicare Other | Attending: Surgery | Admitting: Surgery

## 2014-11-30 DIAGNOSIS — E11621 Type 2 diabetes mellitus with foot ulcer: Secondary | ICD-10-CM | POA: Insufficient documentation

## 2014-11-30 DIAGNOSIS — L97529 Non-pressure chronic ulcer of other part of left foot with unspecified severity: Secondary | ICD-10-CM | POA: Insufficient documentation

## 2014-11-30 DIAGNOSIS — E1140 Type 2 diabetes mellitus with diabetic neuropathy, unspecified: Secondary | ICD-10-CM | POA: Insufficient documentation

## 2014-11-30 DIAGNOSIS — I83223 Varicose veins of left lower extremity with both ulcer of ankle and inflammation: Secondary | ICD-10-CM | POA: Diagnosis not present

## 2014-11-30 NOTE — Progress Notes (Signed)
Penny Clements, Penny Clements ([id number]) Visit Report for 11/30/2014 Arrival Information Details Patient Name: Penny Clements, Penny Clements Date of Service: 11/30/2014 2:30 PM Medical Record Number: [id number] Patient Account Number: [id number] Date of Birth/Sex: [birth year] (81 y.o. Female) Treating RN: Junious Dresser Primary Care Physician: Deborra Medina Other Clinician: Referring Physician: Deborra Medina Treating Physician/Extender: BURNS III, Charlean Sanfilippo in Treatment: 5 Visit Information History Since Last Visit Added or deleted any medications: No Patient Arrived: Gilford Rile Any new allergies or adverse reactions: No Arrival Time: 14:13 Had a fall or experienced change in No Accompanied By: self activities of daily living that may affect Transfer Assistance: None risk of falls: Patient Identification Verified: Yes Signs or symptoms of abuse/neglect since last No Secondary Verification Process Completed: Yes visito Patient Requires Transmission-Based No Hospitalized since last visit: No Precautions: Has Dressing in Place as Prescribed: Yes Patient Has Alerts: Yes Has Compression in Place as Prescribed: Yes Pain Present Now: No Electronic Signature(s) Signed: 11/30/2014 4:24:07 PM By: Junious Dresser RN Entered By: Junious Dresser on 11/30/2014 14:14:28 Penny Clements ([id number]) -------------------------------------------------------------------------------- Encounter Discharge Information Details Patient Name: Penny Clements Date of Service: 11/30/2014 2:30 PM Medical Record Number: [id number] Patient Account Number: [id number] Date of Birth/Sex: Jun 07, 1928 (81 y.o. Female) Treating RN: Primary Care Physician: Deborra Medina Other Clinician: Referring Physician: Deborra Medina Treating Physician/Extender: BURNS III, Charlean Sanfilippo in Treatment: 5 Encounter Discharge Information Items Schedule Follow-up Appointment: No Medication Reconciliation completed No and provided to Patient/Care  Winston Sobczyk: Provided on Clinical Summary of Care: 11/30/2014 Form Type Recipient Paper Patient [facility] Electronic Signature(s) Signed: 11/30/2014 3:10:20 PM By: Ruthine Dose Entered By: Ruthine Dose on 11/30/2014 15:10:20 Penny Clements ([id number]) -------------------------------------------------------------------------------- Lower Extremity Assessment Details Patient Name: Penny Clements Date of Service: 11/30/2014 2:30 PM Medical Record Number: [id number] Patient Account Number: [id number] Date of Birth/Sex: [birth year] (81 y.o. Female) Treating RN: Junious Dresser Primary Care Physician: Deborra Medina Other Clinician: Referring Physician: Deborra Medina Treating Physician/Extender: BURNS III, Charlean Sanfilippo in Treatment: 5 Edema Assessment Assessed: Shirlyn Goltz: Yes] [Right: No] Edema: [Left: Ye] [Right: s] Calf Left: Right: Point of Measurement: 33 cm From Medial Instep 26.2 cm cm Ankle Left: Right: Point of Measurement: 10 cm From Medial Instep 19 cm cm Vascular Assessment Claudication: Claudication Assessment [Left:None] Pulses: Posterior Tibial Palpable: [Left:Yes] Dorsalis Pedis Palpable: [Left:Yes] Extremity colors, hair growth, and conditions: Extremity Color: [Left:Hyperpigmented] Hair Growth on Extremity: [Left:No] Temperature of Extremity: [Left:Warm] Capillary Refill: [Left:< 3 seconds] Dependent Rubor: [Left:No] Blanched when Elevated: [Left:No] Toe Nail Assessment Left: Right: Thick: Yes Discolored: Yes Deformed: Yes Improper Length and Hygiene: No Penny Clements, Penny Clements ([id number]) Electronic Signature(s) Signed: 11/30/2014 4:24:07 PM By: Junious Dresser RN Entered By: Junious Dresser on 11/30/2014 14:29:06 Penny Clements ([id number]) -------------------------------------------------------------------------------- Multi Wound Chart Details Patient Name: Penny Clements Date of Service: 11/30/2014 2:30 PM Medical Record Number: [id number] Patient Account  Number: [id number] Date of Birth/Sex: 1927-11-29 (81 y.o. 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 N/A] Date Acquired: [7:09/20/2014] [N/A:N/A] Weeks of Treatment: [7:10] [N/A:N/A] Wound Status: [7:Open] [N/A:N/A] Measurements L x W x D 2.6x0.8x0.1 [N/A:N/A] (cm) Area (cm) : [7:1.634] [N/A:N/A] Volume (cm) : [7:0.163] [N/A:N/A] %  Reduction in Area: [7:-246.90%] [N/A:N/A] % Reduction in Volume: -73.40% [N/A:N/A] Classification: [7:Full Thickness Without Exposed Support Structures] [N/A:N/A] Exudate Amount: [7:Small] [N/A:N/A] Exudate Type: [7:Serous] [N/A:N/A] Exudate Color: [7:amber] [N/A:N/A] Wound Margin: [7:Distinct, outline attached N/A] Granulation Amount: [7:Medium (34-66%)] [N/A:N/A] Granulation Quality: [7:Pink, Pale] [N/A:N/A] Necrotic Amount: [7:Small (1-33%)] [N/A:N/A] Exposed Structures: [7:Fascia: No Fat: No Tendon: No Muscle: No Joint: No Bone: No] [N/A:N/A] Limited to Skin Breakdown Epithelialization: Medium (34-66%) N/A N/A Periwound Skin Texture: Edema: No N/A N/A Excoriation: No Induration: No Callus: No Crepitus: No Fluctuance: No Friable: No Rash: No Scarring: No Periwound Skin Moist: Yes N/A N/A Moisture: Dry/Scaly: Yes Maceration: No Periwound Skin Color: Erythema: Yes N/A N/A Atrophie Blanche: No Cyanosis: No Ecchymosis: No Hemosiderin Staining: No Mottled: No Pallor: No Rubor: No Temperature: No Abnormality N/A  N/A Tenderness on No N/A N/A Palpation: Wound Preparation: Ulcer Cleansing: Other: N/A N/A soap and water Topical Anesthetic Applied: Other: lidocaine 4% Treatment Notes Electronic Signature(s) Signed: 11/30/2014 4:29:34 PM By: Regan Lemming BSN, RN Entered By: Regan Lemming on 11/30/2014 14:41:53 Penny Clements ([id number]) -------------------------------------------------------------------------------- [place] Details Patient Name: Penny Clements Date of Service: 11/30/2014 2:30 PM Medical Record Number: [id number] Patient Account Number: [id number] Date of Birth/Sex: 17-Mar-1928 (81 y.o. Female) Treating RN: Afful, RN, BSN, Velva Harman Primary Care Physician: Deborra Medina Other Clinician: Referring Physician: Deborra Medina Treating Physician/Extender: BURNS III, Charlean Sanfilippo in Treatment: 5 Active Inactive Abuse / Safety / Falls / Self Care Management Nursing Diagnoses: Potential for falls Goals: Patient will remain injury free Date Initiated: 04/07/2014 Goal Status: Active Interventions: Assess fall risk on admission and as needed Notes: Nutrition Nursing Diagnoses: Impaired glucose control: actual or potential Goals: Patient/caregiver agrees to and verbalizes understanding of need to use nutritional supplements and/or vitamins as prescribed Date Initiated: 04/07/2014 Goal Status: Active Interventions: Assess patient nutrition upon admission and as needed per policy Provide education on nutrition Notes: Orientation to the Wound Care Program Nursing Diagnoses: Knowledge deficit related to the wound healing center program Goals: Penny Clements, Penny Clements ([id number]) Patient/caregiver will verbalize understanding of the [facility] Date Initiated: 04/07/2014 Goal Status: Active Interventions: Provide education on orientation to the wound center Notes: Wound/Skin Impairment Nursing Diagnoses: Impaired tissue  integrity Goals: Patient/caregiver will verbalize understanding of skin care regimen Date Initiated: 04/07/2014 Goal Status: Active Ulcer/skin breakdown will heal within 14 weeks Date Initiated: 04/07/2014 Goal Status: Active Interventions: Assess patient/caregiver ability to obtain necessary supplies Notes: Electronic Signature(s) Signed: 11/30/2014 4:29:34 PM By: Regan Lemming BSN, RN Entered By: Regan Lemming on 11/30/2014 14:41:45 Penny Clements ([id number]) -------------------------------------------------------------------------------- Pain Assessment Details Patient Name: Penny Clements Date of Service: 11/30/2014 2:30 PM Medical Record Number: [id number] Patient Account Number: [id number] Date of Birth/Sex: [birth year] (81 y.o. Female) Treating RN: Junious Dresser Primary Care Physician: Deborra Medina Other Clinician: Referring Physician: Deborra Medina Treating Physician/Extender: BURNS III, Charlean Sanfilippo in Treatment: 5 Active Problems Location of Pain Severity and Description of Pain Patient Has Paino No Site Locations Pain Management and Medication Current Pain Management: Electronic Signature(s) Signed: 11/30/2014 4:24:07 PM By: Junious Dresser RN Entered By: Junious Dresser on 11/30/2014 14:14:39 Penny Clements ([id number]) -------------------------------------------------------------------------------- Patient/Caregiver Education Details Patient Name: Penny Clements Date of Service: 11/30/2014 2:30 PM Medical Record Number: [id number] Patient Account Number: [id number] Date of Birth/Gender: 02-15-1928 (81 y.o. Female) Treating RN: Junious Dresser Primary Care Physician: Deborra Medina Other Clinician: Referring Physician: Deborra Medina Treating Physician/Extender: BURNS III, Charlean Sanfilippo in Treatment: 5 Education Assessment Education Provided To:  Patient Education Topics Provided Basic Hygiene: Methods: Explain/Verbal Responses: State content  correctly Safety: Methods: Explain/Verbal Responses: State content correctly Wound/Skin Impairment: Methods: Explain/Verbal Responses: State content correctly Electronic Signature(s) Signed: 11/30/2014 4:24:07 PM By: Junious Dresser RN Entered By: Junious Dresser on 11/30/2014 15:17:33 Penny Clements ([id number]) -------------------------------------------------------------------------------- Wound Assessment Details Patient Name: Penny Clements Date of Service: 11/30/2014 2:30 PM Medical Record Number: [id number] Patient Account Number: [id number] Date of Birth/Sex: 08/04/1927 (81 y.o. Female) Treating RN: Junious Dresser Primary Care Physician: Deborra Medina Other Clinician: Referring Physician: Deborra Medina Treating Physician/Extender: BURNS III, Charlean Sanfilippo in Treatment: 5 Wound Status Wound Number: 7 Primary Etiology: Arterial Insufficiency Ulcer Wound Location: Left Foot - Dorsal Wound Status: Open Wounding Event: Gradually Appeared Date Acquired: 09/20/2014 Weeks Of Treatment: 10 Clustered Wound: No Photos Photo Uploaded By: Junious Dresser on 11/30/2014 15:42:29 Wound Measurements Length: (cm) 2.6 Width: (cm) 0.8 Depth: (cm) 0.1 Area: (cm) 1.634 Volume: (cm) 0.163 % Reduction in Area: -246.9% % Reduction in Volume: -73.4% Epithelialization: Medium (34-66%) Tunneling: No Undermining: No Wound Description Full Thickness Without Exposed Foul Odor Aft Classification: Support Structures Wound Margin: Distinct, outline attached Exudate Small Amount: Exudate Type: Serous Exudate Color: amber er Cleansing: No Wound Bed Granulation Amount: Medium (34-66%) Exposed Structure Granulation Quality: Pink, Pale Fascia Exposed: No Necrotic Amount: Small (1-33%) Fat Layer Exposed: No Penny Clements, Penny T. ([id number]) Necrotic Quality: Adherent Slough Tendon Exposed: No Muscle Exposed: No Joint Exposed: No Bone Exposed: No Limited to Skin Breakdown Periwound  Skin Texture Texture Color No Abnormalities Noted: No No Abnormalities Noted: No Callus: No Atrophie Blanche: No Crepitus: No Cyanosis: No Excoriation: No Ecchymosis: No Fluctuance: No Erythema: Yes Friable: No Hemosiderin Staining: No Induration: No Mottled: No Localized Edema: No Pallor: No Rash: No Rubor: No Scarring: No Temperature / Pain Moisture Temperature: No Abnormality No Abnormalities Noted: No Dry / Scaly: Yes Maceration: No Moist: Yes Wound Preparation Ulcer Cleansing: Other: soap and water, Topical Anesthetic Applied: Other: lidocaine 4%, Treatment Notes Wound #7 (Left, Dorsal Foot) 1. Cleansed with: Clean wound with Normal Saline 2. Anesthetic Topical Lidocaine 4% cream to wound bed prior to debridement 4. Dressing Applied: Promogran 5. Secondary Dressing Applied Dry Gauze 7. Secured with 4-Layer Compression System - Left Lower Extremity Notes restore foam used to pad top of foot and anterior LE to prevent pressure under wrap Electronic Signature(s) Signed: 11/30/2014 4:24:07 PM By: Junious Dresser RN Entered By: Junious Dresser on 11/30/2014 14:32:44 Penny Clements ([name] ([id number]) -------------------------------------------------------------------------------- Vitals Details Patient Name: Penny Clements Date of Service: 11/30/2014 2:30 PM Medical Record Number: [id number] Patient Account Number: [id number] Date of Birth/Sex: December 07, 1927 (81 y.o. Female) Treating RN: Junious Dresser Primary Care Physician: Deborra Medina Other Clinician: Referring Physician: Deborra Medina Treating Physician/Extender: BURNS III, Charlean Sanfilippo in Treatment: 5 Vital Signs Time Taken: 02:10 Temperature (F): 97.6 Height (in): 63 Pulse (bpm): 103 Weight (lbs): 92 Respiratory Rate (breaths/min): 32 Body Mass Index (BMI): 16.3 Blood Pressure (mmHg): 141/90 Reference Range: 80 - 120 mg / dl Electronic Signature(s) Signed: 11/30/2014  4:24:07 PM By: Junious Dresser RN Entered By: Junious Dresser on 11/30/2014 14:15:11

## 2014-12-05 ENCOUNTER — Encounter: Payer: Self-pay | Admitting: Internal Medicine

## 2014-12-05 MED ORDER — PAROXETINE HCL 10 MG PO TABS: 10.0000 mg | ORAL_TABLET | Freq: Every day | ORAL | Status: AC

## 2014-12-05 NOTE — Progress Notes (Addendum)
Penny, Clements ([id number]) Visit Report for 11/30/2014 Chief Complaint Document Details Patient Name: Penny Clements, Penny Clements Date of Service: 11/30/2014 2:30 PM Medical Record Number: [id number] Patient Account Number: [id number] Date of Birth/Sex: 1927/12/01 (81 y.o. Female) Treating RN: Primary Care Physician: Deborra Medina Other Clinician: Referring Physician: Deborra Medina Treating Physician/Extender: BURNS III, Charlean Sanfilippo in Treatment: 5 Information Obtained from: Patient Chief Complaint Recurrent left lateral foot ulcers. Electronic Signature(s) Signed: 11/30/2014 3:19:31 PM By: Loletha Grayer MD Entered By: Loletha Grayer on 11/30/2014 14:59:23 Penny Clements ([id number]) -------------------------------------------------------------------------------- HPI Details Patient Name: Penny Clements Date of Service: 11/30/2014 2:30 PM Medical Record Number: [id number] Patient Account Number: [id number] Date of Birth/Sex: 1928-03-14 (81 y.o. Female) Treating RN: Primary Care Physician: Deborra Medina Other Clinician: Referring Physician: Deborra Medina Treating Physician/Extender: BURNS III, Charlean Sanfilippo in Treatment: 5 History of Present Illness HPI Description: Very pleasant 81 year old with a past medical history significant for type 2 diabetes and lung cancer (status post chemoradiation). She has had a recurrent left diabetic foot ulcer for almost 10 years, which has healed in the past with compression therapy. This recently recurred in October 2015. No history of trauma. No significant pain. No symptoms to indicate ischemic rest pain or claudication. ABI 1.11. Ambulating per her baseline with a walker. Culture on 05/18/2014 grew methicillin sensitive staph aureus and staph lugdunensis, both sensitive to tetracycline. She was started on doxycycline but developed severe itching. She does not want anymore antibiotics. Punch biopsy on 06/29/2014 showed no evidence for  malignancy. Consistent with stasis dermatitis. Underwent normal CVI ultrasound at [facility] in March 2016. Original ulcer has healed. She developed a second adjacent ulceration dorsally. Treating with regular debridements and 4 layer compression. Improved with epifix x8 (initially applied on 07/06/2014). She returns to clinic for follow-up and is without new complaints. No trauma. No significant pain. No fever or chills. No significant drainage. Electronic Signature(s) Signed: 11/30/2014 3:19:31 PM By: Loletha Grayer MD Entered By: Loletha Grayer on 11/30/2014 14:59:34 Penny Clements ([id number]) -------------------------------------------------------------------------------- Physical Exam Details Patient Name: Penny Clements Date of Service: 11/30/2014 2:30 PM Medical Record Number: [id number] Patient Account Number: [id number] Date of Birth/Sex: [birth year] (81 y.o. Female) Treating RN: Primary Care Physician: Deborra Medina Other Clinician: Referring Physician: Deborra Medina Treating Physician/Extender: BURNS III, Reynol Arnone Weeks in Treatment: 5 Constitutional . Pulse regular. Respirations normal and unlabored. Afebrile. . Notes Original left lateral foot ulceration is completely re-epithelialized. No drainage. No infection. More recent left dorsal foot ulceration is significantly improved. No active infection. Edema well controlled. Palpable DP. Electronic Signature(s) Signed: 11/30/2014 3:19:31 PM By: Loletha Grayer MD Entered By: Loletha Grayer on 11/30/2014 15:00:12 Penny Clements ([id number]) -------------------------------------------------------------------------------- Physician Orders Details Patient Name: Penny Clements Date of Service: 11/30/2014 2:30 PM Medical Record Number: [id number] Patient Account Number: [id number] Date of Birth/Sex: 07-Jun-1928 (81 y.o. Female) Treating RN: Baruch Gouty, RN, BSN, Velva Harman Primary Care Physician: Deborra Medina Other Clinician: Referring Physician: Deborra Medina Treating Physician/Extender: BURNS III, Charlean Sanfilippo in Treatment: 5 Verbal / Phone Orders: Yes Clinician: Afful, RN, BSN, Rita Read Back and Verified: Yes Diagnosis Coding Wound Cleansing Wound #7 Left,Dorsal Foot o May shower with protection. o No tub bath. Skin Barriers/Peri-Wound Care Wound #7 Left,Dorsal Foot o Other: - Nystatin powder Primary Wound Dressing Wound #7 Left,Dorsal Foot o Prisma Ag Secondary Dressing Wound #7 Left,Dorsal Foot o ABD pad Dressing Change Frequency Wound #7 Left,Dorsal Foot o Change dressing every week Follow-up  Appointments Wound #7 Left,Dorsal Foot o Return Appointment in 1 week. Edema Control Wound #7 Left,Dorsal Foot o 4-Layer Compression System - Left Lower Extremity Medications-please add to medication list. Wound #7 Left,Dorsal Foot o Other: - Nystatin Powder. Apply to foot daily to every other day x 2weeks Patient Medications NAKESHA, EBRAHIM ([id number]) Allergies: Bactrim, iodine, doxycycline, Augmentin, Celexa, minocycline, Sulfa (Sulfonamide Antibiotics) Notifications Medication Indication Start End nystatin fungal infection 11/30/2014 DOSE topical 100,000 unit/gram powder - powder topical Electronic Signature(s) Signed: 11/30/2014 4:29:34 PM By: Regan Lemming BSN, RN Signed: 12/05/2014 8:06:57 AM By: Loletha Grayer MD Previous Signature: 11/30/2014 3:19:31 PM Version By: Loletha Grayer MD Entered By: Regan Lemming on 11/30/2014 16:05:23 Penny Clements ([id number]) -------------------------------------------------------------------------------- Prescription 11/30/2014 Patient Name: Penny Noel T. Physician: Loletha Grayer MD Date of Birth: 1928/01/24 NPI#: [id number] Sex: F DEA#: Phone #: [phone] License #: Patient Address: [facility] [place], Penny Clements [address] [address] [place], [place] [address] [phone] Allergies Bactrim Reaction: rash Severity: Moderate iodine doxycycline Reaction: rash Augmentin Celexa minocycline Sulfa (Sulfonamide Antibiotics) Physician's Orders Other: - Nystatin Powder. Apply to foot daily to every other day x 2weeks Clements, Penny. ([id number]) Signature(s): Date(s): ANNAYAH, WORTHLEY ([id number]) Prescription 11/30/2014 Patient Name: CORINA, STACY. Physician: Loletha Grayer MD Date of Birth: July 06, 1927 NPI#: [id number] Sex: F DEA#: Phone #: [phone] License #: Patient Address: [facility] [place], [place] [address] [address], [place], [place] [address] [phone] Allergies Bactrim Reaction: rash Severity: Moderate iodine doxycycline Reaction: rash Augmentin Celexa minocycline Sulfa (Sulfonamide Antibiotics) Medication Medication: Route: Strength: Form: nystatin topical 100,000 unit/gram powder Class: TOPICAL ANTIFUNGALS Dose: Frequency / Time: Indication: powder topical fungal infection Royer, Kenzi T. ([id number]) Number of Refills: Number of Units: 1 Generic Substitution: Start Date: End Date: Administered at Substitution Permitted 12/27/1605 Facility: No Note to Pharmacy: Signature(s): Date(s): Electronic Signature(s) Signed: 11/30/2014 4:29:34 PM By: Regan Lemming BSN, RN Signed: 12/05/2014 8:06:57 AM By: Loletha Grayer MD Entered By: Regan Lemming on 11/30/2014 16:05:23 Penny Clements ([id number]) --------------------------------------------------------------------------------  Problem List Details Patient Name: Penny Clements Date of Service: 11/30/2014 2:30 PM Medical Record Number: [id number] Patient Account Number: [id number] Date of Birth/Sex: 17-Apr-1928 (81 y.o. Female) Treating  RN: Primary Care Physician: Deborra Medina Other Clinician: Referring Physician: Deborra Medina Treating Physician/Extender: BURNS III, Charlean Sanfilippo in Treatment: 5 Active Problems ICD-10 Encounter Code Description Active Date Diagnosis E11.621 Type 2 diabetes mellitus with foot ulcer 04/07/2014 Yes E11.40 Type 2 diabetes mellitus with diabetic neuropathy, 04/27/2014 Yes unspecified I83.223 Varicose veins of left lower extremity with both ulcer of 07/06/2014 Yes ankle and inflammation Inactive Problems ICD-10 Code Description Active Date Inactive Date L03.116 Cellulitis of left lower limb 05/18/2014 05/25/2014 Resolved Problems Electronic Signature(s) Signed: 11/30/2014 3:19:31 PM By: Loletha Grayer MD Entered By: Loletha Grayer on 11/30/2014 14:59:11 Penny Clements ([id number]) -------------------------------------------------------------------------------- Progress Note Details Patient Name: Penny Clements Date of Service: 11/30/2014 2:30 PM Medical Record Number: [id number] Patient Account Number: [id number] Date of Birth/Sex: 09/25/1927 (81 y.o. Female) Treating RN: Primary Care Physician: Deborra Medina Other Clinician: Referring Physician: Deborra Medina Treating Physician/Extender: BURNS III, Charlean Sanfilippo in Treatment: 5 Subjective Chief Complaint Information obtained from Patient Recurrent left lateral foot ulcers. History of Present Illness (HPI) Very pleasant 81 year old with a past medical history significant for type  2 diabetes and lung cancer (status post chemoradiation). She has had a recurrent left diabetic foot ulcer for almost 10 years, which has healed in the past with compression therapy. This recently recurred in October 2015. No history of trauma. No significant pain. No symptoms to indicate ischemic rest pain or claudication. ABI 1.11. Ambulating per her baseline with a walker. Culture on 05/18/2014 grew methicillin sensitive staph aureus and  staph lugdunensis, both sensitive to tetracycline. She was started on doxycycline but developed severe itching. She does not want anymore antibiotics. Punch biopsy on 06/29/2014 showed no evidence for malignancy. Consistent with stasis dermatitis. Underwent normal CVI ultrasound at [facility] in March 2016. Original ulcer has healed. She developed a second adjacent ulceration dorsally. Treating with regular debridements and 4 layer compression. Improved with epifix x8 (initially applied on 07/06/2014). She returns to clinic for follow-up and is without new complaints. No trauma. No significant pain. No fever or chills. No significant drainage. Objective Constitutional Pulse regular. Respirations normal and unlabored. Afebrile. Vitals Time Taken: 2:10 AM, Height: 63 in, Weight: 92 lbs, BMI: 16.3, Temperature: 97.6 F, Pulse: 103 bpm, Respiratory Rate: 32 breaths/min, Blood Pressure: 141/90 mmHg. BRAYLYNN, LEWING TMarland Kitchen ([id number]) General Notes: Original left lateral foot ulceration is completely re-epithelialized. No drainage. No infection. More recent left dorsal foot ulceration is significantly improved. No active infection. Edema well controlled. Palpable DP. Integumentary (Hair, Skin) Wound #7 status is Open. Original cause of wound was Gradually Appeared. The wound is located on the Left,Dorsal Foot. The wound measures 2.6cm length x 0.8cm width x 0.1cm depth; 1.634cm^2 area and 0.163cm^3 volume. The wound is limited to skin breakdown. There is no tunneling or undermining noted. There is a small amount of serous drainage noted. The wound margin is distinct with the outline attached to the wound base. There is medium (34-66%) pink, pale granulation within the wound bed. There is a small (1-33%) amount of necrotic tissue within the wound bed including Adherent Slough. The periwound skin appearance exhibited: Dry/Scaly, Moist, Erythema. The periwound skin appearance did not  exhibit: Callus, Crepitus, Excoriation, Fluctuance, Friable, Induration, Localized Edema, Rash, Scarring, Maceration, Atrophie Blanche, Cyanosis, Ecchymosis, Hemosiderin Staining, Mottled, Pallor, Rubor. The surrounding wound skin color is noted with erythema. Periwound temperature was noted as No Abnormality. 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 No tub bath. Skin Barriers/Peri-Wound Care: Wound #7 Left,Dorsal Foot: KYRSTIN, CAMPILLO T. ([id number]) Other: - Nystatin powder Primary Wound Dressing: Wound #7 Left,Dorsal Foot: Prisma Ag Secondary Dressing: Wound #7 Left,Dorsal Foot: ABD pad Dressing Change Frequency: Wound #7 Left,Dorsal Foot: Change dressing every week Follow-up Appointments: Wound #7 Left,Dorsal Foot: Return Appointment in 1 week. Edema Control: Wound #7 Left,Dorsal Foot: 4-Layer Compression System - Left Lower Extremity Medications-please add to medication list.: Wound #7 Left,Dorsal Foot: Other: - Nystatin Powder. Apply to foot daily to every other day x 2weeks The following medication(s) was prescribed: nystatin topical 100,000 unit/gram powder powder topical for fungal infection starting 11/30/2014 Follow-Up Appointments: A Patient Clinical Summary of Care was provided to [facility].  4-layer compression. Nystatin powder. Electronic Signature(s) Signed: 02/01/2015 9:56:32 AM By: Paulla Fore, RRT, CHT Signed:  02/01/2015 11:00:56 AM By: Loletha Grayer MD Previous Signature: 11/30/2014 3:19:31 PM Version By: Loletha Grayer MD Entered By: Lorine Bears on 02/01/2015 09:56:31 Penny Clements ([id number]) -------------------------------------------------------------------------------- SuperBill Details Patient Name: Penny Clements Date of Service: 11/30/2014 Medical Record Number: [id number] Patient Account Number: [id number] Date of Birth/Sex: 28-Jun-1927 (81 y.o. 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

## 2014-12-05 NOTE — Telephone Encounter (Signed)
Message sent via my chart

## 2014-12-07 ENCOUNTER — Other Ambulatory Visit: Payer: Self-pay | Admitting: Internal Medicine

## 2014-12-07 ENCOUNTER — Encounter: Payer: Medicare Other | Admitting: Surgery

## 2014-12-07 DIAGNOSIS — L97529 Non-pressure chronic ulcer of other part of left foot with unspecified severity: Secondary | ICD-10-CM | POA: Diagnosis not present

## 2014-12-07 MED ORDER — INSULIN ASPART 100 UNIT/ML FLEXPEN: PEN_INJECTOR | SUBCUTANEOUS | Status: AC

## 2014-12-07 NOTE — Progress Notes (Signed)
HOUA, NIE ([id number]) Visit Report for 12/07/2014 Chief Complaint Document Details Patient Name: Penny Clements, Penny Clements Date of Service: 12/07/2014 2:30 PM Medical Record Number: [id number] Patient Account Number: [id number] Date of Birth/Sex: Nov 10, 1927 (81 y.o. Female) Treating RN: Primary Care Physician: Deborra Medina Other Clinician: Referring Physician: Deborra Medina Treating Physician/Extender: BURNS III, Charlean Sanfilippo in Treatment: 6 Information Obtained from: Patient Chief Complaint Recurrent left lateral foot ulcers. Electronic Signature(s) Signed: 12/07/2014 4:35:22 PM By: Loletha Grayer MD Entered By: Loletha Grayer on 12/07/2014 15:30:49 Penny Clements ([id number]) -------------------------------------------------------------------------------- Cellular or Tissue Based Product Details Patient Name: Penny Clements Date of Service: 12/07/2014 2:30 PM Medical Record Number: [id number] Patient Account Number: [id number] Date of Birth/Sex: [birth year] (81 y.o. Female) Treating RN: Primary Care Physician: Deborra Medina Other Clinician: Referring Physician: Deborra Medina Treating Physician/Extender: BURNS III, Charlean Sanfilippo in Treatment: 6 Cellular or Tissue Based Wound #7 Left,Dorsal Foot Product Type Applied to: Performed By: Physician BURNS III, Teressa Senter., MD Cellular or Tissue Based Epifix Product Type: Time-Out Taken: Yes Location: genitalia / hands / feet / multiple digits Wound Size (sq cm): 1.82 Product Size (sq cm): 2 Waste Size (sq cm): 0 Amount of Product Applied (sq cm): 2 Secured: Yes Secured With: Steri-Strips Dressing Applied: Yes Primary Dressing: adaptic Procedural Pain: 0 Post Procedural Pain: 0 Response to Treatment: Procedure was tolerated well Electronic Signature(s) Signed: 12/07/2014 4:35:22 PM By: Loletha Grayer MD Entered By: Loletha Grayer on 12/07/2014 15:30:41 Penny Clements  ([id number]) -------------------------------------------------------------------------------- HPI Details Patient Name: Penny Clements Date of Service: 12/07/2014 2:30 PM Medical Record Number: [id number] Patient Account Number: [id number] Date of Birth/Sex: 07-07-1927 (81 y.o. Female) Treating RN: Primary Care Physician: Deborra Medina Other Clinician: Referring Physician: Deborra Medina Treating Physician/Extender: BURNS III, Charlean Sanfilippo in Treatment: 6 History of Present Illness HPI Description: Very pleasant 81 year old with a past medical history significant for type 2 diabetes and lung cancer (status post chemoradiation). She has had a recurrent left diabetic foot ulcer for almost 10 years, which has healed in the past with compression therapy. This recently recurred in October 2015. No history of trauma. No significant pain. No symptoms to indicate ischemic rest pain or claudication. ABI 1.11. Ambulating per her baseline with a walker. Culture on 05/18/2014 grew methicillin sensitive staph aureus and staph lugdunensis, both sensitive to tetracycline. She was started on doxycycline but developed severe itching. She does not want anymore antibiotics. Punch biopsy on 06/29/2014 showed no evidence for malignancy. Consistent with stasis dermatitis. Underwent normal CVI ultrasound at [facility] in March 2016. Original ulcer has healed. She developed a second adjacent ulceration dorsally. Treating with regular debridements and 4 layer compression. Improved with epifix x8 (initially applied on 07/06/2014). She returns to clinic for follow-up and is without new complaints. No trauma. No significant pain. No fever or chills. No significant drainage. Electronic Signature(s) Signed: 12/07/2014 4:35:22 PM By: Loletha Grayer MD Entered By: Loletha Grayer on 12/07/2014 15:31:06 Penny Clements  ([id number]) -------------------------------------------------------------------------------- Physical Exam Details Patient Name: Penny Clements Date of Service: 12/07/2014 2:30 PM Medical Record Number: [id number] Patient Account Number: [id number] Date of Birth/Sex: 01/24/1928 (81 y.o. Female) Treating RN: Primary Care Physician: Deborra Medina Other Clinician: Referring Physician: Deborra Medina Treating Physician/Extender: BURNS III, Suzzane Quilter Weeks in Treatment: 6 Constitutional . Pulse regular. Respirations normal and unlabored. Afebrile. . Notes Original left lateral foot ulceration is completely re-epithelialized. No drainage. No infection. More recent left dorsal foot ulceration is significantly  improved with Epifix. No active infection. Edema well controlled. Palpable DP. Electronic Signature(s) Signed: 12/07/2014 4:35:22 PM By: Loletha Grayer MD Entered By: Loletha Grayer on 12/07/2014 15:31:38 Penny Clements ([id number]) -------------------------------------------------------------------------------- Physician Orders Details Patient Name: Penny Clements Date of Service: 12/07/2014 2:30 PM Medical Record Number: [id number] Patient Account Number: [id number] Date of Birth/Sex: [birth year] (81 y.o. Female) Treating RN: Baruch Gouty, RN, BSN, Velva Harman Primary Care Physician: Deborra Medina Other Clinician: Referring Physician: Deborra Medina Treating Physician/Extender: BURNS III, Charlean Sanfilippo in Treatment: 6 Verbal / Phone Orders: Yes Clinician: Afful, RN, BSN, Rita Read Back and Verified: Yes Diagnosis Coding Wound Cleansing Wound #7 Left,Dorsal Foot o No tub bath. Primary Wound Dressing Wound #7 Left,Dorsal Foot o Other: - 98mm Epifix applied in clinic Dressing Change Frequency Wound #7 Left,Dorsal Foot o Change dressing every week Follow-up Appointments Wound #7 Left,Dorsal Foot o Return Appointment in 1 week. Edema Control Wound #7 Left,Dorsal  Foot o 4-Layer Compression System - Left Lower Extremity Advanced Therapies Wound #7 Left,Dorsal Foot o EpiFix application in clinic; including contact layer, fixation with steri strips, dry gauze and cover dressing. Electronic Signature(s) Signed: 12/07/2014 3:02:48 PM By: Regan Lemming BSN, RN Signed: 12/07/2014 3:28:51 PM By: Loletha Grayer MD Entered By: Regan Lemming on 12/07/2014 15:02:47 Penny Clements ([id number]) -------------------------------------------------------------------------------- Problem List Details Patient Name: Penny Clements Date of Service: 12/07/2014 2:30 PM Medical Record Number: [id number] Patient Account Number: [id number] Date of Birth/Sex: [birth year] (81 y.o. Female) Treating RN: Primary Care Physician: Deborra Medina Other Clinician: Referring Physician: Deborra Medina Treating Physician/Extender: BURNS III, Charlean Sanfilippo in Treatment: 6 Active Problems ICD-10 Encounter Code Description Active Date Diagnosis E11.621 Type 2 diabetes mellitus with foot ulcer 04/07/2014 Yes E11.40 Type 2 diabetes mellitus with diabetic neuropathy, 04/27/2014 Yes unspecified I83.223 Varicose veins of left lower extremity with both ulcer of 07/06/2014 Yes ankle and inflammation Inactive Problems ICD-10 Code Description Active Date Inactive Date L03.116 Cellulitis of left lower limb 05/18/2014 05/25/2014 Resolved Problems Electronic Signature(s) Signed: 12/07/2014 4:35:22 PM By: Loletha Grayer MD Entered By: Loletha Grayer on 12/07/2014 15:29:49 Penny Clements ([id number]) -------------------------------------------------------------------------------- Progress Note Details Patient Name: Penny Clements Date of Service: 12/07/2014 2:30 PM Medical Record Number: [id number] Patient Account Number: [id number] Date of Birth/Sex: [birth year] (81 y.o. Female) Treating RN: Primary Care Physician: Deborra Medina Other Clinician: Referring Physician:  Deborra Medina Treating Physician/Extender: BURNS III, Charlean Sanfilippo in Treatment: 6 Subjective Chief Complaint Information obtained from Patient Recurrent left lateral foot ulcers. History of Present Illness (HPI) Very pleasant 81 year old with a past medical history significant for type 2 diabetes and lung cancer (status post chemoradiation). She has had a recurrent left diabetic foot ulcer for almost 10 years, which has healed in the past with compression therapy. This recently recurred in October 2015. No history of trauma. No significant pain. No symptoms to indicate ischemic rest pain or claudication. ABI 1.11. Ambulating per her baseline with a walker. Culture on 05/18/2014 grew methicillin sensitive staph aureus and staph lugdunensis, both sensitive to tetracycline. She was started on doxycycline but developed severe itching. She does not want anymore antibiotics. Punch biopsy on 06/29/2014 showed no evidence for malignancy. Consistent with stasis dermatitis. Underwent normal CVI ultrasound at [facility] in March 2016. Original ulcer has healed. She developed a second adjacent ulceration dorsally. Treating with regular debridements and 4 layer compression. Improved with epifix x8 (initially applied on 07/06/2014). She returns to clinic for follow-up and is without new complaints.  No trauma. No significant pain. No fever or chills. No significant drainage. Objective Constitutional Pulse regular. Respirations normal and unlabored. Afebrile. Vitals Time Taken: 2:40 PM, Height: 63 in, Weight: 92 lbs, BMI: 16.3, Temperature: 97.8 F, Pulse: 98 bpm, Respiratory Rate: 22 breaths/min, Blood Pressure: 138/76 mmHg. Penny Clements, Penny TMarland Kitchen ([id number]) General Notes: Original left lateral foot ulceration is completely re-epithelialized. No drainage. No infection. More recent left dorsal foot ulceration is significantly improved with Epifix. No active infection. Edema  well controlled. Palpable DP. Integumentary (Hair, Skin) Wound #7 status is Open. Original cause of wound was Gradually Appeared. The wound is located on the Left,Dorsal Foot. The wound measures 2.6cm length x 0.7cm width x 0.1cm depth; 1.429cm^2 area and 0.143cm^3 volume. The wound is limited to skin breakdown. There is no tunneling or undermining noted. There is a small amount of serous drainage noted. The wound margin is distinct with the outline attached to the wound base. There is medium (34-66%) pink, pale granulation within the wound bed. There is no necrotic tissue within the wound bed. The periwound skin appearance exhibited: Dry/Scaly, Moist, Erythema. The periwound skin appearance did not exhibit: Callus, Crepitus, Excoriation, Fluctuance, Friable, Induration, Localized Edema, Rash, Scarring, Maceration, Atrophie Blanche, Cyanosis, Ecchymosis, Hemosiderin Staining, Mottled, Pallor, Rubor. The surrounding wound skin color is noted with erythema. Periwound temperature was noted as No Abnormality. 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 Procedures Wound #7 Wound #7 is an Arterial Insufficiency Ulcer located on the Left,Dorsal Foot. A skin graft procedure using a bioengineered skin substitute/cellular or tissue based product was performed by BURNS III, Teressa Senter., MD. Epifix was applied and secured with Steri-Strips. 2 sq cm of product was utilized and 0 sq cm was wasted. Post Application, adaptic was applied. A Time Out was conducted prior to the start of the procedure. The procedure was tolerated well with a pain level of 0 throughout and a pain level of 0 following the procedure. Penny Clements, Penny Clements ([id number]) Plan Wound Cleansing: Wound  #7 Left,Dorsal Foot: No tub bath. Primary Wound Dressing: Wound #7 Left,Dorsal Foot: Other: - 47mm Epifix applied in clinic Dressing Change Frequency: Wound #7 Left,Dorsal Foot: Change dressing every week Follow-up Appointments: Wound #7 Left,Dorsal Foot: Return Appointment in 1 week. Edema Control: Wound #7 Left,Dorsal Foot: 4-Layer Compression System - Left Lower Extremity Advanced Therapies: Wound #7 Left,Dorsal Foot: EpiFix application in clinic; including contact layer, fixation with steri strips, dry gauze and cover dressing. Epifix #9 applied today. Continue with 4-layer compression. Electronic Signature(s) Signed: 12/07/2014 4:35:22 PM By: Loletha Grayer MD Entered By: Loletha Grayer on 12/07/2014 15:32:19 Penny Clements ([id number]) -------------------------------------------------------------------------------- SuperBill Details Patient Name: Penny Clements Date of Service: 12/07/2014 Medical Record Number: [id number] Patient Account Number: [id number] Date of Birth/Sex: Sep 02, 1927 (81 y.o. 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 of ankle and inflammation Facility Procedures CPT4 Code: [id number] Description: (Facility Use Only) F2924 o Epifix o Per 1 SQ CM Modifier: Quantity: 2 CPT4 Code: [id number] Description: 77116 - SKIN  SUB GRAFT FACE/NK/HF/G ICD-10 Description Diagnosis E11.621 Type 2 diabetes mellitus with foot ulcer Modifier: Quantity: 1 Physician Procedures CPT4 Code: 2233612 Description: 24497 - WC PHYS SKIN SUB GRAFT FACE/NK/HF/G ICD-10 Description Diagnosis E11.621 Type 2  diabetes mellitus with foot ulcer Modifier: Quantity: 1 Electronic Signature(s) Signed: 12/07/2014 4:35:22 PM By: Loletha Grayer MD Entered By: Loletha Grayer on 12/07/2014 15:32:35

## 2014-12-07 NOTE — Progress Notes (Signed)
Penny Clements ([id number]) Visit Report for 12/07/2014 Arrival Information Details Patient Name: Penny Clements Date of Service: 12/07/2014 2:30 PM Medical Record Number: [id number] Patient Account Number: [id number] Date of Birth/Sex: [birth year] (81 y.o. Female) Treating RN: Afful, RN, BSN, Velva Harman Primary Care Physician: Deborra Medina Other Clinician: Referring Physician: Deborra Medina Treating Physician/Extender: BURNS III, Charlean Sanfilippo in Treatment: 6 Visit Information History Since Last Visit Any new allergies or adverse reactions: No Patient Arrived: Wheel Chair Signs or symptoms of abuse/neglect since last No visito Arrival Time: 14:40 Has Dressing in Place as Prescribed: Yes Accompanied By: self Has Compression in Place as Prescribed: Yes Transfer Assistance: None Pain Present Now: No Patient Identification Verified: Yes Secondary Verification Process Yes Completed: Patient Requires Transmission-Based No Precautions: Patient Has Alerts: Yes Electronic Signature(s) Signed: 12/07/2014 4:56:06 PM By: Regan Lemming BSN, RN Entered By: Regan Lemming on 12/07/2014 14:40:36 Penny Clements ([id number]) -------------------------------------------------------------------------------- Encounter Discharge Information Details Patient Name: Penny Clements Date of Service: 12/07/2014 2:30 PM Medical Record Number: [id number] Patient Account Number: [id number] Date of Birth/Sex: 02/12/1928 (81 y.o. Female) Treating RN: Primary Care Physician: Deborra Medina Other Clinician: Referring Physician: Deborra Medina Treating Physician/Extender: BURNS III, Charlean Sanfilippo in Treatment: 6 Encounter Discharge Information Items Schedule Follow-up Appointment: No Medication Reconciliation completed No and provided to Patient/Care Penny Clements: Provided on Clinical Summary of Care: 12/07/2014 Form Type Recipient Paper Patient [facility] Electronic Signature(s) Signed: 12/07/2014 3:30:51 PM By:  Ruthine Dose Entered By: Ruthine Dose on 12/07/2014 15:30:51 Penny Clements ([id number]) -------------------------------------------------------------------------------- Lower Extremity Assessment Details Patient Name: Penny Clements Date of Service: 12/07/2014 2:30 PM Medical Record Number: [id number] Patient Account Number: [id number] Date of Birth/Sex: 1928-02-25 (81 y.o. Female) Treating RN: Afful, RN, BSN, Velva Harman Primary Care Physician: Deborra Medina Other Clinician: Referring Physician: Deborra Medina Treating Physician/Extender: BURNS III, Charlean Sanfilippo in Treatment: 6 Edema Assessment Assessed: Shirlyn Goltz: Yes] [Right: No] Edema: [Left: Ye] [Right: s] Calf Left: Right: Point of Measurement: 33 cm From Medial Instep 27.3 cm cm Ankle Left: Right: Point of Measurement: 10 cm From Medial Instep 19.5 cm cm Vascular Assessment Claudication: Claudication Assessment [Left:None] Pulses: Posterior Tibial Palpable: [Left:Yes] Dorsalis Pedis Palpable: [Left:Yes] Extremity colors, hair growth, and conditions: Extremity Color: [Left:Hyperpigmented] Hair Growth on Extremity: [Left:No] Temperature of Extremity: [Left:Warm] Capillary Refill: [Left:< 3 seconds] Dependent Rubor: [Left:No] Blanched when Elevated: [Left:No] Toe Nail Assessment Left: Right: Discolored: Yes Deformed: Yes Improper Length and Hygiene: No Electronic Signature(s) Penny Clements, Penny Clements ([id number]) Signed: 12/07/2014 4:56:06 PM By: Regan Lemming BSN, RN Entered By: Regan Lemming on 12/07/2014 14:50:26 Penny Clements ([id number]) -------------------------------------------------------------------------------- Multi Wound Chart Details Patient Name: Penny Clements Date of Service: 12/07/2014 2:30 PM Medical Record Number: [id number] Patient Account Number: [id number] Date of Birth/Sex: 1928/04/21 (81 y.o. 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 N/A] Date Acquired: [7:09/20/2014] [N/A:N/A] Weeks of Treatment: [7:11] [N/A:N/A] Wound Status: [7:Open] [N/A:N/A] Measurements L x W x D 2.6x0.7x0.1 [N/A:N/A] (cm) Area (cm) : [7:1.429] [N/A:N/A] Volume (cm) : [7:0.143] [N/A:N/A] % Reduction in Area: [7:-203.40%] [N/A:N/A] % Reduction in Volume: -52.10% [N/A:N/A] Classification: [7:Full Thickness Without Exposed Support Structures] [N/A:N/A] Exudate Amount: [7:Small] [N/A:N/A]  Exudate Type: [7:Serous] [N/A:N/A] Exudate Color: [7:amber] [N/A:N/A] Wound Margin: [7:Distinct, outline attached N/A] Granulation Amount: [7:Medium (34-66%)] [N/A:N/A] Granulation Quality: [7:Pink, Pale] [N/A:N/A] Necrotic Amount: [7:None Present (0%)] [N/A:N/A] Exposed Structures: [7:Fascia: No Fat: No Tendon: No Muscle: No Joint: No Bone: No] [N/A:N/A] Limited to Skin Breakdown Epithelialization: Medium (34-66%) N/A N/A Periwound Skin Texture: Edema: No N/A N/A Excoriation: No Induration: No Callus: No Crepitus: No Fluctuance: No Friable: No Rash: No Scarring: No Periwound Skin Moist: Yes N/A N/A Moisture: Dry/Scaly: Yes Maceration: No Periwound Skin Color: Erythema: Yes N/A N/A Atrophie Blanche: No Cyanosis: No Ecchymosis: No Hemosiderin Staining: No Mottled: No Pallor: No Rubor: No Temperature: No Abnormality N/A N/A Tenderness on No N/A N/A Palpation: Wound Preparation: Ulcer Cleansing: Other: N/A N/A soap and water Topical Anesthetic Applied: Other:  lidocaine 4% Treatment Notes Electronic Signature(s) Signed: 12/07/2014 4:56:06 PM By: Regan Lemming BSN, RN Entered By: Regan Lemming on 12/07/2014 15:00:04 Penny Clements ([id number]) -------------------------------------------------------------------------------- [place] Details Patient Name: Penny Clements Date of Service: 12/07/2014 2:30 PM Medical Record Number: [id number] Patient Account Number: [id number] Date of Birth/Sex: 09/27/1927 (81 y.o. Female) Treating RN: Afful, RN, BSN, Velva Harman Primary Care Physician: Deborra Medina Other Clinician: Referring Physician: Deborra Medina Treating Physician/Extender: BURNS III, Charlean Sanfilippo in Treatment: 6 Active Inactive Abuse / Safety / Falls / Self Care Management Nursing Diagnoses: Potential for falls Goals: Patient will remain injury free Date Initiated: 04/07/2014 Goal Status: Active Interventions: Assess fall risk on admission and as needed Notes: Nutrition Nursing Diagnoses: Impaired glucose control: actual or potential Goals: Patient/caregiver agrees to and verbalizes understanding of need to use nutritional supplements and/or vitamins as prescribed Date Initiated: 04/07/2014 Goal Status: Active Interventions: Assess patient nutrition upon admission and as needed per policy Provide education on nutrition Notes: Orientation to the Wound Care Program Nursing Diagnoses: Knowledge deficit related to the wound healing center program Goals: Penny Clements, Penny Clements ([id number]) Patient/caregiver will verbalize understanding of the [place] Date Initiated: 04/07/2014 Goal Status: Active Interventions: Provide education on orientation to the wound center Notes: Wound/Skin Impairment Nursing Diagnoses: Impaired tissue integrity Goals: Patient/caregiver will verbalize understanding of skin care regimen Date Initiated: 04/07/2014 Goal Status: Active Ulcer/skin breakdown will heal  within 14 weeks Date Initiated: 04/07/2014 Goal Status: Active Interventions: Assess patient/caregiver ability to obtain necessary supplies Notes: Electronic Signature(s) Signed: 12/07/2014 4:56:06 PM By: Regan Lemming BSN, RN Entered By: Regan Lemming on 12/07/2014 14:59:38 Penny Clements ([id number]) -------------------------------------------------------------------------------- Pain Assessment Details Patient Name: Penny Clements Date of Service: 12/07/2014 2:30 PM Medical Record Number: [id number] Patient Account Number: [id number] Date of Birth/Sex: 31-Jan-1928 (81 y.o. Female) Treating RN: Baruch Gouty, RN, BSN, Velva Harman Primary Care Physician: Deborra Medina Other Clinician: Referring Physician: Deborra Medina Treating Physician/Extender: BURNS III, Charlean Sanfilippo in Treatment: 6 Active Problems Location of Pain Severity and Description of Pain Patient Has Paino No Site Locations Pain Management and Medication Current Pain Management: Electronic Signature(s) Signed: 12/07/2014 4:56:06 PM By: Regan Lemming BSN, RN Entered By: Regan Lemming on 12/07/2014 14:40:50 Penny Clements ([id number]) -------------------------------------------------------------------------------- Wound Assessment Details Patient Name: Penny Clements Date of Service: 12/07/2014 2:30 PM Medical Record Number: [id number] Patient Account Number: [id number] Date of Birth/Sex: 1927/06/26 (81 y.o. Female) Treating RN: Baruch Gouty, RN, BSN, Velva Harman Primary Care Physician: Deborra Medina Other Clinician: Referring Physician: Deborra Medina Treating Physician/Extender: BURNS III, Charlean Sanfilippo in Treatment: 6 Wound Status Wound Number: 7 Primary Etiology: Arterial Insufficiency Ulcer Wound Location: Left Foot - Dorsal Wound Status: Open Wounding Event: Gradually  Appeared Date Acquired: 09/20/2014 Weeks Of Treatment: 11 Clustered Wound: No Photos Photo Uploaded By: Junious Dresser on 12/07/2014 16:45:47 Wound  Measurements Length: (cm) 2.6 Width: (cm) 0.7 Depth: (cm) 0.1 Area: (cm) 1.429 Volume: (cm) 0.143 % Reduction in Area: -203.4% % Reduction in Volume: -52.1% Epithelialization: Medium (34-66%) Tunneling: No Undermining: No Wound Description Full Thickness Without Exposed Foul Odor Aft Classification: Support Structures Wound Margin: Distinct, outline attached Exudate Small Amount: Exudate Type: Serous Exudate Color: amber er Cleansing: No Wound Bed Granulation Amount: Medium (34-66%) Exposed Structure Granulation Quality: Pink, Pale Fascia Exposed: No Necrotic Amount: None Present (0%) Fat Layer Exposed: No Penny Clements, Penny T. ([id number]) Tendon Exposed: No Muscle Exposed: No Joint Exposed: No Bone Exposed: No Limited to Skin Breakdown Periwound Skin Texture Texture Color No Abnormalities Noted: No No Abnormalities Noted: No Callus: No Atrophie Blanche: No Crepitus: No Cyanosis: No Excoriation: No Ecchymosis: No Fluctuance: No Erythema: Yes Friable: No Hemosiderin Staining: No Induration: No Mottled: No Localized Edema: No Pallor: No Rash: No Rubor: No Scarring: No Temperature / Pain Moisture Temperature: No Abnormality No Abnormalities Noted: No Dry / Scaly: Yes Maceration: No Moist: Yes Wound Preparation Ulcer Cleansing: Other: soap and water, Topical Anesthetic Applied: Other: lidocaine 4%, Electronic Signature(s) Signed: 12/07/2014 4:56:06 PM By: Regan Lemming BSN, RN Entered By: Regan Lemming on 12/07/2014 14:51:44 Penny Clements ([id number]) -------------------------------------------------------------------------------- Vitals Details Patient Name: Penny Clements Date of Service: 12/07/2014 2:30 PM Medical Record Number: [id number] Patient Account Number: [id number] Date of Birth/Sex: October 20, 1927 (81 y.o. Female) Treating RN: Afful, RN, BSN, [place] Primary Care Physician: Deborra Medina Other Clinician: Referring Physician: Deborra Medina Treating Physician/Extender: BURNS III, Charlean Sanfilippo in Treatment: 6 Vital Signs Time Taken: 14:40 Temperature (F): 97.8 Height (in): 63 Pulse (bpm): 98 Weight (lbs): 92 Respiratory Rate (breaths/min): 22 Body Mass Index (BMI): 16.3 Blood Pressure (mmHg): 138/76 Reference Range: 80 - 120 mg / dl Electronic Signature(s) Signed: 12/07/2014 4:56:06 PM By: Regan Lemming BSN, RN Entered By: Regan Lemming on 12/07/2014 14:42:44

## 2014-12-14 ENCOUNTER — Encounter: Payer: Self-pay | Admitting: *Deleted

## 2014-12-14 ENCOUNTER — Encounter: Payer: Medicare Other | Admitting: Surgery

## 2014-12-16 ENCOUNTER — Ambulatory Visit: Payer: Self-pay | Admitting: Surgery

## 2014-12-19 ENCOUNTER — Encounter: Payer: Medicare Other | Admitting: Surgery

## 2014-12-19 DIAGNOSIS — L97529 Non-pressure chronic ulcer of other part of left foot with unspecified severity: Secondary | ICD-10-CM | POA: Diagnosis not present

## 2014-12-20 ENCOUNTER — Telehealth: Payer: Self-pay | Admitting: *Deleted

## 2014-12-20 ENCOUNTER — Encounter: Payer: Self-pay | Admitting: Internal Medicine

## 2014-12-20 MED ORDER — INSULIN GLARGINE 100 UNIT/ML SOLOSTAR PEN: PEN_INJECTOR | SUBCUTANEOUS | Status: AC

## 2014-12-20 NOTE — Telephone Encounter (Signed)
pts son called requesting lab orders be sent to [place] prior to her appoint on 6.30.16.  Please advise

## 2014-12-20 NOTE — Telephone Encounter (Signed)
Fax from pharmacy, needing verification of current doses for insurance audit (max daily dose). See mychart message from patient. Rx sent to pharmacy by escript

## 2014-12-20 NOTE — Progress Notes (Signed)
KAIDAN, SPENGLER ([id number]) Visit Report for 12/19/2014 Arrival Information Details Patient Name: Penny Clements, Penny Clements Date of Service: 12/19/2014 3:45 PM Medical Record Number: [id number] Patient Account Number: [id number] Date of Birth/Sex: [birth year] (81 y.o. Female) Treating RN: Cornell Barman Primary Care Physician: Deborra Medina Other Clinician: Referring Physician: Deborra Medina Treating Physician/Extender: Frann Rider in Treatment: 7 Visit Information History Since Last Visit Added or deleted any medications: No Patient Arrived: Gilford Rile Any new allergies or adverse reactions: No Arrival Time: 15:43 Had a fall or experienced change in No Accompanied By: self activities of daily living that may affect Transfer Assistance: None risk of falls: Patient Identification Verified: Yes Signs or symptoms of abuse/neglect since last No Secondary Verification Process Yes visito Completed: Hospitalized since last visit: No Patient Requires Transmission-Based No Has Dressing in Place as Prescribed: Yes Precautions: Has Compression in Place as Prescribed: Yes Patient Has Alerts: Yes Pain Present Now: No Electronic Signature(s) Signed: 12/19/2014 5:55:26 PM By: Gretta Cool, RN, BSN, Kim RN, BSN Entered By: Gretta Cool, RN, BSN, Kim on 12/19/2014 15:44:14 Penny Clements ([id number]) -------------------------------------------------------------------------------- Encounter Discharge Information Details Patient Name: Penny Clements Date of Service: 12/19/2014 3:45 PM Medical Record Number: [id number] Patient Account Number: [id number] Date of Birth/Sex: May 30, 1928 (81 y.o. Female) Treating RN: Primary Care Physician: Deborra Medina Other Clinician: Referring Physician: Deborra Medina Treating Physician/Extender: Frann Rider in Treatment: 7 Encounter Discharge Information Items Discharge Pain Level: 0 Discharge Condition: Stable Ambulatory Status: Walker Discharge  Destination: Home Transportation: Private Auto Accompanied By: self Schedule Follow-up Appointment: Yes Medication Reconciliation completed and provided to Patient/Care Yes Amylah Will: Provided on Clinical Summary of Care: 12/19/2014 Form Type Recipient Paper Patient [facility] Electronic Signature(s) Signed: 12/19/2014 5:55:26 PM By: Gretta Cool RN, BSN, Kim RN, BSN Previous Signature: 12/19/2014 4:20:47 PM Version By: Ruthine Dose Entered By: Gretta Cool RN, BSN, Kim on 12/19/2014 16:38:41 Penny Clements ([id number]) -------------------------------------------------------------------------------- Lower Extremity Assessment Details Patient Name: Penny Clements Date of Service: 12/19/2014 3:45 PM Medical Record Number: [id number] Patient Account Number: [id number] Date of Birth/Sex: 1928-04-09 (81 y.o. Female) Treating RN: Cornell Barman Primary Care Physician: Deborra Medina Other Clinician: Referring Physician: Deborra Medina Treating Physician/Extender: Frann Rider in Treatment: 7 Edema Assessment Assessed: Shirlyn Goltz: No] [Right: No] E[Left: dema] [Right: :] Calf Left: Right: Point of Measurement: 33 cm From Medial Instep 26.5 cm cm Ankle Left: Right: Point of Measurement: 10 cm From Medial Instep 19.5 cm cm Vascular Assessment Pulses: Posterior Tibial Dorsalis Pedis Doppler: [Left:Multiphasic] Extremity colors, hair growth, and conditions: Extremity Color: [Left:Hyperpigmented] Hair Growth on Extremity: [Left:No] Temperature of Extremity: [Left:Cool] Capillary Refill: [Left:< 3 seconds] Toe Nail Assessment Left: Right: Thick: Yes Discolored: Yes Deformed: Yes Improper Length and Hygiene: Yes Electronic Signature(s) Signed: 12/19/2014 5:55:26 PM By: Gretta Cool, RN, BSN, Kim RN, BSN Entered By: Gretta Cool, RN, BSN, Kim on 12/19/2014 15:55:53 Penny Clements ([id number]) -------------------------------------------------------------------------------- [place]  Details Patient Name: Penny Clements Date of Service: 12/19/2014 3:45 PM Medical Record Number: [id number] Patient Account Number: [id number] Date of Birth/Sex: 09/25/1927 (81 y.o. Female) Treating RN: Cornell Barman Primary Care Physician: Deborra Medina Other Clinician: Referring Physician: Deborra Medina Treating Physician/Extender: Frann Rider in Treatment: 7 Active Inactive Abuse / Safety / Falls / Self Care Management Nursing Diagnoses: Potential for falls Goals: Patient will remain injury free Date Initiated: 04/07/2014 Goal Status: Active Interventions: Assess fall risk on admission and as needed Notes: Nutrition Nursing Diagnoses: Impaired glucose control: actual or potential Goals: Patient/caregiver agrees to and verbalizes understanding of need  to use nutritional supplements and/or vitamins as prescribed Date Initiated: 04/07/2014 Goal Status: Active Interventions: Assess patient nutrition upon admission and as needed per policy Provide education on nutrition Notes: Orientation to the Wound Care Program Nursing Diagnoses: Knowledge deficit related to the wound healing center program Goals: KRYSTALL, KRUCKENBERG ([id number]) Patient/caregiver will verbalize understanding of the [place] Program Date Initiated: 04/07/2014 Goal Status: Active Interventions: Provide education on orientation to the wound center Notes: Wound/Skin Impairment Nursing Diagnoses: Impaired tissue integrity Goals: Patient/caregiver will verbalize understanding of skin care regimen Date Initiated: 04/07/2014 Goal Status: Active Ulcer/skin breakdown will heal within 14 weeks Date Initiated: 04/07/2014 Goal Status: Active Interventions: Assess patient/caregiver ability to obtain necessary supplies Notes: Electronic Signature(s) Signed: 12/19/2014 5:55:26 PM By: Gretta Cool, RN, BSN, Kim RN, BSN Entered By: Gretta Cool, RN, BSN, Kim on 12/19/2014 16:35:40 Penny Clements  ([id number]) -------------------------------------------------------------------------------- Pain Assessment Details Patient Name: Penny Clements Date of Service: 12/19/2014 3:45 PM Medical Record Number: [id number] Patient Account Number: [id number] Date of Birth/Sex: April 22, 1928 (81 y.o. Female) Treating RN: Cornell Barman Primary Care Physician: Deborra Medina Other Clinician: Referring Physician: Deborra Medina Treating Physician/Extender: Frann Rider in Treatment: 7 Active Problems Location of Pain Severity and Description of Pain Patient Has Paino No Site Locations Pain Management and Medication Current Pain Management: Electronic Signature(s) Signed: 12/19/2014 5:55:26 PM By: Gretta Cool, RN, BSN, Kim RN, BSN Entered By: Gretta Cool, RN, BSN, Kim on 12/19/2014 15:44:21 Penny Clements ([id number]) -------------------------------------------------------------------------------- Patient/Caregiver Education Details Patient Name: Penny Clements Date of Service: 12/19/2014 3:45 PM Medical Record Number: [id number] Patient Account Number: [id number] Date of Birth/Gender: Oct 29, 1927 (81 y.o. Female) Treating RN: Cornell Barman Primary Care Physician: Deborra Medina Other Clinician: Referring Physician: Deborra Medina Treating Physician/Extender: Frann Rider in Treatment: 7 Education Assessment Education Provided To: Patient Education Topics Provided Wound/Skin Impairment: Handouts: Caring for Your Ulcer, Other: continue wound care as prescribed Electronic Signature(s) Signed: 12/19/2014 5:55:26 PM By: Gretta Cool, RN, BSN, Kim RN, BSN Entered By: Gretta Cool, RN, BSN, Kim on 12/19/2014 16:39:13 Penny Clements ([id number]) -------------------------------------------------------------------------------- Wound Assessment Details Patient Name: Penny Clements Date of Service: 12/19/2014 3:45 PM Medical Record Number: [id number] Patient Account Number: [id number] Date of Birth/Sex:  1928/06/08 (81 y.o. Female) Treating RN: Cornell Barman Primary Care Physician: Deborra Medina Other Clinician: Referring Physician: Deborra Medina Treating Physician/Extender: Frann Rider in Treatment: 7 Wound Status Wound Number: 7 Primary Etiology: Arterial Insufficiency Ulcer Wound Location: Left Foot - Dorsal Wound Status: Open Wounding Event: Gradually Appeared Date Acquired: 09/20/2014 Weeks Of Treatment: 12 Clustered Wound: No Photos Photo Uploaded By: Gretta Cool, RN, BSN, Kim on 12/19/2014 17:54:33 Wound Measurements Length: (cm) 1.4 Width: (cm) 0.4 Depth: (cm) 0.1 Area: (cm) 0.44 Volume: (cm) 0.044 % Reduction in Area: 6.6% % Reduction in Volume: 53.2% Epithelialization: Medium (34-66%) Wound Description Full Thickness Without Exposed Foul Odor Aft Classification: Support Structures Wound Margin: Distinct, outline attached Exudate Small Amount: Exudate Type: Serous Exudate Color: amber er Cleansing: No Wound Bed Granulation Amount: Medium (34-66%) Exposed Structure Granulation Quality: Pink, Pale Fascia Exposed: No Necrotic Amount: None Present (0%) Fat Layer Exposed: No [place], Soley T. ([id number]) Tendon Exposed: No Muscle Exposed: No Joint Exposed: No Bone Exposed: No Limited to Skin Breakdown Periwound Skin Texture Texture Color No Abnormalities Noted: No No Abnormalities Noted: No Callus: No Atrophie Blanche: No Crepitus: No Cyanosis: No Excoriation: No Ecchymosis: No Fluctuance: No Erythema: Yes Friable: No Hemosiderin Staining: No Induration: No Mottled: No Localized Edema: No Pallor: No Rash: No Rubor:  No Scarring: No Temperature / Pain Moisture Temperature: No Abnormality No Abnormalities Noted: No Dry / Scaly: Yes Maceration: No Moist: Yes Wound Preparation Ulcer Cleansing: Other: soap and water, Topical Anesthetic Applied: Other: lidocaine 4%, Treatment Notes Wound #7 (Left, Dorsal Foot) 1. Cleansed with: Clean  wound with Normal Saline 2. Anesthetic Topical Lidocaine 4% cream to wound bed prior to debridement 4. Dressing Applied: Promogran 5. Secondary Dressing Applied ABD Pad 7. Secured with Tubular bandage 4-Layer Compression System - Left Lower Extremity Notes ABD pad used to pad top of foot and anterior LE to prevent pressure under wrap. Nystatin powder applied to dorsal foot. Electronic Signature(s) CHANCE, KARAM ([id number]) Signed: 12/19/2014 5:55:26 PM By: Gretta Cool, RN, BSN, Kim RN, BSN Entered By: Gretta Cool, RN, BSN, Kim on 12/19/2014 15:57:07 Penny Clements ([id number]) -------------------------------------------------------------------------------- [place] Details Patient Name: Penny Clements Date of Service: 12/19/2014 3:45 PM Medical Record Number: [id number] Patient Account Number: [id number] Date of Birth/Sex: 1927/08/22 (81 y.o. Female) Treating RN: Cornell Barman Primary Care Physician: Deborra Medina Other Clinician: Referring Physician: Deborra Medina Treating Physician/Extender: Frann Rider in Treatment: 7 Vital Signs Time Taken: 15:44 Temperature (F): 98.0 Height (in): 63 Pulse (bpm): 104 Weight (lbs): 92 Respiratory Rate (breaths/min): 20 Body Mass Index (BMI): 16.3 Blood Pressure (mmHg): 153/53 Reference Range: 80 - 120 mg / dl Electronic Signature(s) Signed: 12/19/2014 5:55:26 PM By: Gretta Cool, RN, BSN, Kim RN, BSN Entered By: Gretta Cool, RN, BSN, Kim on 12/19/2014 15:44:49

## 2014-12-20 NOTE — Telephone Encounter (Signed)
Appt 12/22/14

## 2014-12-20 NOTE — Progress Notes (Signed)
Penny Clements, Penny Clements ([id number]) Visit Report for 12/19/2014 Chief Complaint Document Details Patient Name: Penny Clements, Penny Clements Date of Service: 12/19/2014 3:45 PM Medical Record Number: [id number] Patient Account Number: [id number] Date of Birth/Sex: Mar 10, 1928 (81 y.o. Female) Treating RN: Primary Care Physician: Deborra Medina Other Clinician: Referring Physician: Deborra Medina Treating Physician/Extender: Frann Rider in Treatment: 7 Information Obtained from: Patient Chief Complaint Recurrent left lateral foot ulcers. Electronic Signature(s) Signed: 12/19/2014 4:47:08 PM By: Christin Fudge MD, FACS Entered By: Christin Fudge on 12/19/2014 16:07:13 Penny Clements ([id number]) -------------------------------------------------------------------------------- HPI Details Patient Name: Penny Clements Date of Service: 12/19/2014 3:45 PM Medical Record Number: [id number] Patient Account Number: [id number] Date of Birth/Sex: 07-Mar-1928 (81 y.o. Female) Treating RN: Primary Care Physician: Deborra Medina Other Clinician: Referring Physician: Deborra Medina Treating Physician/Extender: Frann Rider in Treatment: 7 History of Present Illness HPI Description: Very pleasant 81 year old with a past medical history significant for type 2 diabetes and lung cancer (status post chemoradiation). She has had a recurrent left diabetic foot ulcer for almost 10 years, which has healed in the past with compression therapy. This recently recurred in October 2015. No history of trauma. No significant pain. No symptoms to indicate ischemic rest pain or claudication. ABI 1.11. Ambulating per her baseline with a walker. Culture on 05/18/2014 grew methicillin sensitive staph aureus and staph lugdunensis, both sensitive to tetracycline. She was started on doxycycline but developed severe itching. She does not want anymore antibiotics. Punch biopsy on 06/29/2014 showed no evidence for malignancy.  Consistent with stasis dermatitis. Underwent normal CVI ultrasound at [facility] in March 2016. Original ulcer has healed. She developed a second adjacent ulceration dorsally. Treating with regular debridements and 4 layer compression. Improved with epifix x8 (initially applied on 07/06/2014). She returns to clinic for follow-up and is without new complaints. No trauma. No significant pain. No fever or chills. No significant drainage. 12/19/2014 -- she was unable to keep her appointment since the last application of epifix and has come here for the first time since 12/07/2014. Her compression wrap has been intact for all these days and she has no fresh complaints. Electronic Signature(s) Signed: 12/19/2014 4:47:08 PM By: Christin Fudge MD, FACS Entered By: Christin Fudge on 12/19/2014 16:08:07 Penny Clements ([id number]) -------------------------------------------------------------------------------- Physical Exam Details Patient Name: Penny Clements Date of Service: 12/19/2014 3:45 PM Medical Record Number: [id number] Patient Account Number: [id number] Date of Birth/Sex: 11/04/1927 (81 y.o. Female) Treating RN: Primary Care Physician: Deborra Medina Other Clinician: Referring Physician: Deborra Medina Treating Physician/Extender: Frann Rider in Treatment: 7 Constitutional . Pulse regular. Respirations normal and unlabored. Afebrile. . Eyes Nonicteric. Reactive to light. Ears, Nose, Mouth, and Throat Lips, teeth, and gums WNL.Marland Kitchen Moist mucosa without lesions . Neck supple and nontender. No palpable supraclavicular or cervical adenopathy. Normal sized without goiter. Respiratory WNL. No retractions.. Cardiovascular Pedal Pulses WNL. No clubbing, cyanosis or edema. Musculoskeletal Adexa without tenderness or enlargement.. Digits and nails w/o clubbing, cyanosis, infection, petechiae, ischemia, or inflammatory conditions.. Integumentary (Hair, Skin) No  suspicious lesions. No crepitus or fluctuance. No peri-wound warmth or erythema. No masses.Marland Kitchen Psychiatric Judgement and insight Intact.. No evidence of depression, anxiety, or agitation.. Notes The area where Epifix was used looks clean. there are some areas where it looks like she has a fungal infection and nystatin powder has been used. Electronic Signature(s) Signed: 12/19/2014 4:47:08 PM By: Christin Fudge MD, FACS Entered By: Christin Fudge on 12/19/2014 16:10:01 Penny Clements ([id number]) -------------------------------------------------------------------------------- Physician Orders Details Patient  Name: Penny Clements Date of Service: 12/19/2014 3:45 PM Medical Record Number: [id number] Patient Account Number: [id number] Date of Birth/Sex: 06-03-1928 (81 y.o. 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 Anesthetic Wound #7 Left,Dorsal Foot o Topical Lidocaine 4% cream applied to wound bed prior to debridement Primary Wound Dressing Wound #7 Left,Dorsal Foot o Promogran Dressing Change Frequency Wound #7 Left,Dorsal Foot o Change dressing every week Follow-up Appointments Wound #7 Left,Dorsal Foot o Return Appointment in 1 week. Edema Control Wound #7 Left,Dorsal Foot o 4-Layer Compression System - Left Lower Extremity Electronic Signature(s) Signed: 12/19/2014 4:47:08 PM By: Christin Fudge MD, FACS Signed: 12/19/2014 5:55:26  PM By: Gretta Cool RN, BSN, Kim RN, BSN [place], Suprina TMarland Kitchen ([id number]) Entered By: Gretta Cool RN, BSN, Kim on 12/19/2014 16:36:27 Penny Clements ([id number]) -------------------------------------------------------------------------------- Problem List Details Patient Name: Penny Clements Date of Service: 12/19/2014 3:45 PM Medical Record Number: [id number] Patient Account Number: [id number] Date of Birth/Sex: Dec 08, 1927 (81 y.o. Female) Treating RN: Primary Care Physician: Deborra Medina Other Clinician: Referring Physician: Deborra Medina Treating Physician/Extender: Frann Rider in Treatment: 7 Active Problems ICD-10 Encounter Code Description Active Date Diagnosis E11.621 Type 2 diabetes mellitus with foot ulcer 04/07/2014 Yes E11.40 Type 2 diabetes mellitus with diabetic neuropathy, 04/27/2014 Yes unspecified I83.223 Varicose veins of left lower extremity with both ulcer of 07/06/2014 Yes ankle and inflammation Inactive Problems ICD-10 Code Description Active Date Inactive Date L03.116 Cellulitis of left lower limb 05/18/2014 05/25/2014 Resolved Problems Electronic Signature(s) Signed: 12/19/2014 4:47:08 PM By: Christin Fudge MD, FACS Entered By: Christin Fudge on 12/19/2014 16:07:04 Penny Clements ([id number]) -------------------------------------------------------------------------------- Progress Note Details Patient Name: Penny Clements Date of Service: 12/19/2014 3:45 PM Medical Record Number: [id number] Patient Account Number: [id number] Date of Birth/Sex: 1928-02-27 (81 y.o. Female) Treating RN: Primary Care Physician: Deborra Medina Other Clinician: Referring Physician: Deborra Medina Treating Physician/Extender: Frann Rider in Treatment: 7 Subjective Chief Complaint Information obtained from Patient Recurrent left lateral foot ulcers. History of Present Illness (HPI) Very pleasant 81 year old with a past medical history significant for type 2  diabetes and lung cancer (status post chemoradiation). She has had a recurrent left diabetic foot ulcer for almost 10 years, which has healed in the past with compression therapy. This recently recurred in October 2015. No history of trauma. No significant pain. No symptoms to indicate ischemic rest pain or claudication. ABI 1.11. Ambulating per her baseline with a walker. Culture on 05/18/2014 grew methicillin sensitive staph aureus and staph lugdunensis, both sensitive to tetracycline. She was started on doxycycline but developed severe itching. She does not want anymore antibiotics. Punch biopsy on 06/29/2014 showed no evidence for malignancy. Consistent with stasis dermatitis. Underwent normal CVI ultrasound at [facility] in March 2016. Original ulcer has healed. She developed a second adjacent ulceration dorsally. Treating with regular debridements and 4 layer compression. Improved with epifix x8 (initially applied on 07/06/2014). She returns to clinic for follow-up and is without new complaints. No trauma. No significant pain. No fever or chills. No significant drainage. 12/19/2014 -- she was  unable to keep her appointment since the last application of epifix and has come here for the first time since 12/07/2014. Her compression wrap has been intact for all these days and she has no fresh complaints. Objective Constitutional Pulse regular. Respirations normal and unlabored. Afebrile. Vitals Time Taken: 3:44 PM, Height: 63 in, Weight: 92 lbs, BMI: 16.3, Temperature: 98.0 F, Pulse: 104 bpm, Respiratory Rate: 20 breaths/min, Blood Pressure: 153/53 mmHg. Penny Clements, Penny T. ([id number]) Eyes Nonicteric. Reactive to light. Ears, Nose, Mouth, and Throat Lips, teeth, and gums WNL.Marland Kitchen Moist mucosa without lesions . Neck supple and nontender. No palpable supraclavicular or cervical adenopathy. Normal sized without goiter. Respiratory WNL. No  retractions.. Cardiovascular Pedal Pulses WNL. No clubbing, cyanosis or edema. Musculoskeletal Adexa without tenderness or enlargement.. Digits and nails w/o clubbing, cyanosis, infection, petechiae, ischemia, or inflammatory conditions.Marland Kitchen Psychiatric Judgement and insight Intact.. No evidence of depression, anxiety, or agitation.. General Notes: The area where Epifix was used looks clean. there are some areas where it looks like she has a fungal infection and nystatin powder has been used. Integumentary (Hair, Skin) No suspicious lesions. No crepitus or fluctuance. No peri-wound warmth or erythema. No masses.. Wound #7 status is Open. Original cause of wound was Gradually Appeared. The wound is located on the Left,Dorsal Foot. The wound measures 1.4cm length x 0.4cm width x 0.1cm depth; 0.44cm^2 area and 0.044cm^3 volume. The wound is limited to skin breakdown. There is a small amount of serous drainage noted. The wound margin is distinct with the outline attached to the wound base. There is medium (34-66%) pink, pale granulation within the wound bed. There is no necrotic tissue within the wound bed. The periwound skin appearance exhibited: Dry/Scaly, Moist, Erythema. The periwound skin appearance did not exhibit: Callus, Crepitus, Excoriation, Fluctuance, Friable, Induration, Localized Edema, Rash, Scarring, Maceration, Atrophie Blanche, Cyanosis, Ecchymosis, Hemosiderin Staining, Mottled, Pallor, Rubor. The surrounding wound skin color is noted with erythema. Periwound temperature was noted as No Abnormality. 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 For the rest of the  foot where she has some fungal infection, we can use nystatin powder. She will continue to have a 4-layer compression wrap and will see Dr. Quay Burow next week. Plan Wound Cleansing: Wound #7 Left,Dorsal Foot: No tub bath. Anesthetic: Wound #7 Left,Dorsal Foot: Topical Lidocaine 4% cream applied to wound bed prior to debridement Primary Wound Dressing: Wound #7 Left,Dorsal Foot: Promogran Dressing Change Frequency: Wound #7 Left,Dorsal Foot: Change dressing every week Follow-up Appointments: Wound #7 Left,Dorsal Foot: Return Appointment in 1 week. Edema Control: Wound #7 Left,Dorsal Foot: 4-Layer Compression System - Left Lower Extremity She has had 9 applications of epifix to this area and today we will apply collagen. For the rest of the foot where she has some fungal infection, we can use nystatin powder. She will continue to have a 4-layer compression wrap and will see Dr. Quay Burow next week. Electronic Signature(s) Signed: 12/19/2014 5:09:44 PM By: Christin Fudge MD, FACS Penny Clements ([id number]) Previous Signature: 12/19/2014 4:47:08 PM Version By: Christin Fudge MD, FACS Entered By: Christin Fudge on 12/19/2014 17:08:50 Penny Clements ([id number]) -------------------------------------------------------------------------------- SuperBill Details Patient Name: Penny Clements Date of Service: 12/19/2014 Medical Record Number: [id number]  Patient Account Number: [id number] Date of Birth/Sex: 1928-04-04 (81 y.o. 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 Facility Procedures CPT4:  Description Modifier Quantity Code [id number] (Facility Use Only) [phone] - [place] LT 1 LEG Physician Procedures CPT4: Description Modifier Quantity Code [id number] - WC PHYS LEVEL 3 - EST PT 1 ICD-10 Description Diagnosis E11.621 Type 2 diabetes mellitus with foot ulcer E11.40 Type 2 diabetes mellitus with diabetic neuropathy, unspecified I83.223 Varicose veins  of left lower extremity with both ulcer of ankle and inflammation Electronic Signature(s) Signed: 12/19/2014 4:47:08 PM By: Christin Fudge MD, FACS Signed: 12/19/2014 5:55:26 PM By: Gretta Cool RN, BSN, Kim RN, BSN Entered By: Gretta Cool, RN, BSN, Kim on 12/19/2014 16:39:40

## 2014-12-20 NOTE — Telephone Encounter (Signed)
Letter printed.  Please send to [facility]

## 2014-12-21 ENCOUNTER — Encounter: Payer: Medicare Other | Admitting: Surgery

## 2014-12-21 NOTE — Telephone Encounter (Signed)
Orders faxed to [place]

## 2014-12-22 ENCOUNTER — Telehealth: Payer: Self-pay | Admitting: Internal Medicine

## 2014-12-22 ENCOUNTER — Encounter: Payer: Self-pay | Admitting: Internal Medicine

## 2014-12-22 ENCOUNTER — Encounter: Payer: Self-pay | Admitting: *Deleted

## 2014-12-22 ENCOUNTER — Ambulatory Visit (INDEPENDENT_AMBULATORY_CARE_PROVIDER_SITE_OTHER): Payer: Medicare Other | Admitting: Internal Medicine

## 2014-12-22 VITALS — BP 126/60 | HR 97 | Temp 97.9°F | Resp 12 | Ht 63.0 in | Wt 97.5 lb

## 2014-12-22 DIAGNOSIS — R0989 Other specified symptoms and signs involving the circulatory and respiratory systems: Secondary | ICD-10-CM | POA: Diagnosis not present

## 2014-12-22 DIAGNOSIS — IMO0002 Reserved for concepts with insufficient information to code with codable children: Secondary | ICD-10-CM

## 2014-12-22 DIAGNOSIS — E1151 Type 2 diabetes mellitus with diabetic peripheral angiopathy without gangrene: Secondary | ICD-10-CM

## 2014-12-22 DIAGNOSIS — E1165 Type 2 diabetes mellitus with hyperglycemia: Secondary | ICD-10-CM

## 2014-12-22 DIAGNOSIS — R634 Abnormal weight loss: Secondary | ICD-10-CM

## 2014-12-22 DIAGNOSIS — F418 Other specified anxiety disorders: Secondary | ICD-10-CM | POA: Diagnosis not present

## 2014-12-22 LAB — HEPATIC FUNCTION PANEL
ALT: 12 U/L (ref 7–35)
AST: 23 U/L (ref 13–35)
Alkaline Phosphatase: 79 U/L (ref 25–125)
Bilirubin, Total: 0.3 mg/dL

## 2014-12-22 LAB — LIPID PANEL
CHOLESTEROL: 192 mg/dL (ref 0–200)
HDL: 104 mg/dL — AB (ref 35–70)
LDL Cholesterol: 78 mg/dL
Triglycerides: 51 mg/dL (ref 40–160)

## 2014-12-22 LAB — BASIC METABOLIC PANEL
BUN: 29 mg/dL — AB (ref 4–21)
CREATININE: 0.8 mg/dL (ref 0.5–1.1)
Glucose: 57 mg/dL
POTASSIUM: 4.1 mmol/L (ref 3.4–5.3)
Sodium: 144 mmol/L (ref 137–147)

## 2014-12-22 LAB — HEMOGLOBIN A1C: Hgb A1c MFr Bld: 7.8 % — AB (ref 4.0–6.0)

## 2014-12-22 NOTE — Patient Instructions (Signed)
The growth on your leg is nothing that needs a biopsy  It is a seborrheic keratosis or an atypical nevus,  NOT CANCER.  I agree with starting the paxil at 1/2 tablet daily after dinner  Here are the names of several well respected female therapists that  we have referred our pateints to over the past 5 years  Lennon Alstrom    [phone] [place]   [phone] [place] [phone]  Achilles Dunk  [phone]  Altha Harm

## 2014-12-22 NOTE — Progress Notes (Signed)
Subjective:  Patient ID: Penny Clements, female    DOB: [birth year]  Age: 81 y.o. MRN: [id number]  CC: The primary encounter diagnosis was Depression with anxiety. Diagnoses of Diminished pulses in lower extremity, Loss of weight, and Diabetes mellitus type II, uncontrolled were also pertinent to this visit.  HPI Penny Clements presents for follow up on DM uncontrolled with diabetic foot ulcer,  underweight, depression iron deficiency anemia.. Was referred to [facility] and [facility] for diabetic ulcer,  One appt noted with Penny Clements,  circulation was evaluated an normal  Per patient,  Confirmed by Penny Clements had recurrent wounds and is still seeing Penny Clements   Has gained 5 lbs ,  Now drinking whole milk,  Goid intake of nuts and cheese.  Has a growth on or near her vagina which is  not symptomatic,  Needs looking at   Depressive mood discussed along with prior medicaation trials...      Outpatient Prescriptions Prior to Visit  Medication Sig Dispense Refill  . aspirin 81 MG tablet Take 81 mg by mouth daily.    . Ergocalciferol (VITAMIN D2) 2000 UNITS TABS Take by mouth.    . Ferrous Sulfate (IRON) 90 (18 FE) MG TABS Take 1 tablet by mouth 2 (two) times daily.    . ferrous sulfate 324 (65 FE) MG TBEC Take 1 tablet (325 mg total) by mouth daily after supper. 90 tablet 0  . fish oil-omega-3 fatty acids 1000 MG capsule Take 2 g by mouth daily.    . insulin aspart (NOVOLOG FLEXPEN) 100 UNIT/ML FlexPen INJECT 4 UNITS SQ TWICE A DAY WITH BREAKFAST AND LUNCH; INCREASE AS NEEDED FOR CBG > 150 30 mL 2  . Insulin Glargine (LANTUS SOLOSTAR) 100 UNIT/ML Solostar Pen INJECT 8 UNITS IN AM AND 5 UNITS IN EVENING 15 mL 0  . Insulin Pen Needle (B-D ULTRAFINE III SHORT PEN) 31G X 8 MM MISC USE AS PER PACKAGE INSTRUCTIONS FIVE INJECTIONS PER DAY 200 each 5  . Multiple Vitamin (MULTIVITAMIN) tablet Take 1 tablet by mouth daily.    . cephALEXin (KEFLEX) 500 MG capsule Take 1 capsule (500 mg total) by mouth 4 (four)  times daily. (Patient not taking: Reported on 12/22/2014) 28 capsule 0  . Lactobacillus-Inulin ([place]) CAPS Take 1 capsule by mouth daily. (Patient not taking: Reported on 12/22/2014) 14 capsule 0  . PARoxetine (PAXIL) 10 MG tablet Take 1 tablet (10 mg total) by mouth daily. (Patient not taking: Reported on 12/22/2014) 30 tablet 3  . Tdap (BOOSTRIX) 5-2.5-18.5 LF-MCG/0.5 injection Inject 0.5 mLs into the muscle once. 0.5 mL 0   No facility-administered medications prior to visit.    Review of Systems;  Patient denies headache, fevers, malaise, unintentional weight loss, skin rash, eye pain, sinus congestion and sinus pain, sore throat, dysphagia,  hemoptysis , cough, dyspnea, wheezing, chest pain, palpitations, orthopnea, edema, abdominal pain, nausea, melena, diarrhea, constipation, flank pain, dysuria, hematuria, urinary  Frequency, nocturia, numbness, tingling, seizures,  Focal weakness, Loss of consciousness,  Tremor, insomnia, depression, anxiety, and suicidal ideation.      Objective:  BP 126/60 mmHg  Pulse 97  Temp(Src) 97.9 F (36.6 C) (Oral)  Resp 12  Ht 5\' 3"  (1.6 m)  Wt 97 lb 8 oz (44.226 kg)  BMI 17.28 kg/m2  SpO2 98%  BP Readings from Last 3 Encounters:  12/22/14 126/60  03/30/14 108/60  03/08/14 138/88    Wt Readings from Last 3 Encounters:  12/22/14 97 lb 8  oz (44.226 kg)  03/30/14 92 lb 12 oz (42.071 kg)  03/08/14 91 lb 8 oz (41.504 kg)    General appearance: alert, cooperative and appears stated age Ears: normal TM's and external ear canals both ears Throat: lips, mucosa, and tongue normal; teeth and gums normal Neck: no adenopathy, no carotid bruit, supple, symmetrical, trachea midline and thyroid not enlarged, symmetric, no tenderness/mass/nodules Back: symmetric, no curvature. ROM normal. No CVA tenderness. Lungs: clear to auscultation bilaterally Heart: regular rate and rhythm, S1, S2 normal, no murmur, click, rub or gallop Abdomen:  soft, non-tender; bowel sounds normal; no masses,  no organomegaly Pulses: 2+ and symmetric Skin: Skin color, texture, turgor normal. No rashes or lesions Lymph nodes: Cervical, supraclavicular, and axillary nodes normal.  Lab Results  Component Value Date   HGBA1C 7.8* 12/22/2014   HGBA1C 8.6* 03/08/2014   HGBA1C 7.9* 11/09/2013    Lab Results  Component Value Date   CREATININE 0.8 12/22/2014   CREATININE 1.0 03/08/2014   CREATININE 1.0 11/09/2013    Lab Results  Component Value Date   WBC 10.8* 03/08/2014   HGB 10.1* 03/08/2014   HCT 33.4* 03/08/2014   PLT 287.0 03/08/2014   GLUCOSE 128* 03/08/2014   CHOL 192 12/22/2014   TRIG 51 12/22/2014   HDL 104* 12/22/2014   LDLCALC 78 12/22/2014   ALT 12 12/22/2014   AST 23 12/22/2014   NA 144 12/22/2014   K 4.1 12/22/2014   CL 105 03/08/2014   CREATININE 0.8 12/22/2014   BUN 29* 12/22/2014   CO2 24 03/08/2014   TSH 2.10 07/12/2013   HGBA1C 7.8* 12/22/2014   MICROALBUR 13.1* 03/17/2014    No results found.  Assessment & Plan:   Problem List Items Addressed This Visit      Unprioritized   Diabetes mellitus type II, uncontrolled    Awaiting recent labs done at [facility]. Marland Kitchen   Uncontrolled despite frequent hypoglycemic events.  Advised her to suspend the bedtime novolog and bring BS log in after two weeks.  Foot exam is abnormal,  And she is being referred to [facility] for decreased pulses on today's exam. She is reminded to have her annual eye exam and has been continually unable to provide urine for assessement of proteinuria.  Lab Results  Component Value Date   HGBA1C 7.8* 12/22/2014   Lab Results  Component Value Date   MICROALBUR 13.1* 03/17/2014            Depression with anxiety - Primary    Trial of paxil advised,  Along with psychotherapy.        Loss of weight    Improved with dietary changes.      Diminished pulses in lower extremity    circulatio has been evaluated per patient ,  ([facility] referral made) and no interventions needed          I have discontinued Penny Clements's Tdap. I am also having her maintain her aspirin, fish oil-omega-3 fatty acids, Vitamin D2, multivitamin, ferrous sulfate, Iron, cephALEXin, [employer], Insulin Pen Needle, PARoxetine, insulin aspart, and Insulin Glargine.  No orders of the defined types were placed in this encounter.    Medications Discontinued During This Encounter  Medication Reason  . Tdap (BOOSTRIX) 5-2.5-18.5 LF-MCG/0.5 injection Error   A total of 40 minutes was spent with patient  And son Sonia Side, more than half of which was spent in counseling patient on the above mentioned issues ,  and coordination of care.  Follow-up:  Return in about 3 months (around 03/24/2015) for follow up diabetes.   Crecencio Mc, MD

## 2014-12-22 NOTE — Telephone Encounter (Signed)
Notified Lori labs should go stat in order to be back for appointment.

## 2014-12-24 NOTE — Assessment & Plan Note (Signed)
Awaiting recent labs done at [facility]. Marland Kitchen   Uncontrolled despite frequent hypoglycemic events.  Advised her to suspend the bedtime novolog and bring BS log in after two weeks.  Foot exam is abnormal,  And she is being referred to [facility] for decreased pulses on today's exam. She is reminded to have her annual eye exam and has been continually unable to provide urine for assessement of proteinuria.  Lab Results  Component Value Date   HGBA1C 7.8* 12/22/2014   Lab Results  Component Value Date   MICROALBUR 13.1* 03/17/2014

## 2014-12-24 NOTE — Assessment & Plan Note (Signed)
Improved with dietary changes.

## 2014-12-24 NOTE — Assessment & Plan Note (Signed)
Trial of paxil advised,  Along with psychotherapy.

## 2014-12-24 NOTE — Assessment & Plan Note (Addendum)
Circulation  has been evaluated per patient , ([facility] referral made) and no interventions needed

## 2014-12-26 ENCOUNTER — Telehealth: Payer: Self-pay | Admitting: Internal Medicine

## 2014-12-26 NOTE — Telephone Encounter (Signed)
My chart message sent re labs

## 2014-12-28 ENCOUNTER — Encounter: Payer: Medicare Other | Attending: Surgery | Admitting: Surgery

## 2014-12-28 DIAGNOSIS — I83223 Varicose veins of left lower extremity with both ulcer of ankle and inflammation: Secondary | ICD-10-CM | POA: Insufficient documentation

## 2014-12-28 DIAGNOSIS — L97529 Non-pressure chronic ulcer of other part of left foot with unspecified severity: Secondary | ICD-10-CM | POA: Insufficient documentation

## 2014-12-28 DIAGNOSIS — E11621 Type 2 diabetes mellitus with foot ulcer: Secondary | ICD-10-CM | POA: Insufficient documentation

## 2014-12-28 DIAGNOSIS — E1140 Type 2 diabetes mellitus with diabetic neuropathy, unspecified: Secondary | ICD-10-CM | POA: Insufficient documentation

## 2014-12-29 NOTE — Progress Notes (Addendum)
KYNLI, CHOU ([id number]) Visit Report for 12/28/2014 Chief Complaint Document Details Patient Name: Penny Clements, Penny Clements Date of Service: 12/28/2014 2:30 PM Medical Record Number: [id number] Patient Account Number: [id number] Date of Birth/Sex: 12-10-1927 (81 y.o. Female) Treating RN: Primary Care Physician: Deborra Medina Other Clinician: Referring Physician: Deborra Medina Treating Physician/Extender: BURNS III, Charlean Sanfilippo in Treatment: 9 Information Obtained from: Patient Chief Complaint Recurrent left lateral foot ulcers. Electronic Signature(s) Signed: 12/29/2014 9:32:06 PM By: Loletha Grayer MD Entered By: Loletha Grayer on 12/29/2014 21:03:42 Penny Clements ([id number]) -------------------------------------------------------------------------------- HPI Details Patient Name: Penny Clements Date of Service: 12/28/2014 2:30 PM Medical Record Number: [id number] Patient Account Number: [id number] Date of Birth/Sex: Jan 28, 1928 (81 y.o. Female) Treating RN: Primary Care Physician: Deborra Medina Other Clinician: Referring Physician: Deborra Medina Treating Physician/Extender: BURNS III, Charlean Sanfilippo in Treatment: 9 History of Present Illness HPI Description: Very pleasant 81 year old with a past medical history significant for type 2 diabetes and lung cancer (status post chemoradiation). She has had a recurrent left diabetic foot ulcer for almost 10 years, which has healed in the past with compression therapy. This recently recurred in October 2015. No history of trauma. No significant pain. No symptoms to indicate ischemic rest pain or claudication. ABI 1.11. Ambulating per her baseline with a walker. Culture on 05/18/2014 grew methicillin sensitive staph aureus and staph lugdunensis, both sensitive to tetracycline. She was started on doxycycline but developed severe itching. She does not want anymore antibiotics. Punch biopsy on 06/29/2014 showed no evidence for  malignancy. Consistent with stasis dermatitis. Underwent normal CVI ultrasound at [facility] in March 2016. Original ulcer has healed. She developed a second adjacent ulceration dorsally. Treating with regular debridements and 4 layer compression. Improved with epifix x8 (initially applied on 07/06/2014). She returns to clinic for follow-up and is without new complaints. No trauma. No significant pain. No fever or chills. No drainage. Electronic Signature(s) Signed: 12/29/2014 9:32:06 PM By: Loletha Grayer MD Entered By: Loletha Grayer on 12/29/2014 21:04:19 Penny Clements ([id number]) -------------------------------------------------------------------------------- Physical Exam Details Patient Name: Penny Clements Date of Service: 12/28/2014 2:30 PM Medical Record Number: [id number] Patient Account Number: [id number] Date of Birth/Sex: Jul 08, 1927 (81 y.o. Female) Treating RN: Primary Care Physician: Deborra Medina Other Clinician: Referring Physician: Deborra Medina Treating Physician/Extender: BURNS III, Oakleigh Hesketh Weeks in Treatment: 9 Constitutional . Pulse regular. Respirations normal and unlabored. Afebrile. . Notes Residual L foot ulcer is reepithelialized. No drainage. No cellulitis. No significant edema. Palpable DP. Electronic Signature(s) Signed: 12/29/2014 9:32:06 PM By: Loletha Grayer MD Entered By: Loletha Grayer on 12/29/2014 21:05:27 Penny Clements ([id number]) -------------------------------------------------------------------------------- Physician Orders Details Patient Name: Penny Clements Date of Service: 12/28/2014 2:30 PM Medical Record Number: [id number] Patient Account Number: [id number] Date of Birth/Sex: 1927/10/09 (81 y.o. Female) Treating RN: Cornell Barman Primary Care Physician: Deborra Medina Other Clinician: Referring Physician: Deborra Medina Treating Physician/Extender: BURNS III, Charlean Sanfilippo in Treatment: 9 Verbal  / Phone Orders: Yes Clinician: Cornell Barman Read Back and Verified: Yes Diagnosis Coding Follow-up Appointments o Return Appointment in 2 weeks. - Recheck in two weeks Electronic Signature(s) Signed: 12/28/2014 4:34:24 PM By: Loletha Grayer MD Signed: 12/28/2014 5:13:59 PM By: Gretta Cool RN, BSN, Kim RN, BSN Entered By: Gretta Cool, RN, BSN, Kim on 12/28/2014 15:16:46 Penny Clements ([id number]) -------------------------------------------------------------------------------- Problem List Details Patient Name: Penny Clements Date of Service: 12/28/2014 2:30 PM Medical Record Number: [id number] Patient Account Number: [id number] Date of Birth/Sex: 1928-05-26 (81 y.o. Female)  Treating RN: Primary Care Physician: Deborra Medina Other Clinician: Referring Physician: Deborra Medina Treating Physician/Extender: BURNS III, Charlean Sanfilippo in Treatment: 9 Active Problems ICD-10 Encounter Code Description Active Date Diagnosis E11.621 Type 2 diabetes mellitus with foot ulcer 04/07/2014 Yes E11.40 Type 2 diabetes mellitus with diabetic neuropathy, 04/27/2014 Yes unspecified I83.223 Varicose veins of left lower extremity with both ulcer of 07/06/2014 Yes ankle and inflammation Inactive Problems ICD-10 Code Description Active Date Inactive Date L03.116 Cellulitis of left lower limb 05/18/2014 05/25/2014 Resolved Problems Electronic Signature(s) Signed: 12/29/2014 9:32:06 PM By: Loletha Grayer MD Entered By: Loletha Grayer on 12/29/2014 21:03:00 Penny Clements ([id number]) -------------------------------------------------------------------------------- Progress Note Details Patient Name: Penny Clements Date of Service: 12/28/2014 2:30 PM Medical Record Number: [id number] Patient Account Number: [id number] Date of Birth/Sex: Sep 28, 1927 (81 y.o. Female) Treating RN: Primary Care Physician: Deborra Medina Other Clinician: Referring Physician: Deborra Medina Treating Physician/Extender:  BURNS III, Charlean Sanfilippo in Treatment: 9 Subjective Chief Complaint Information obtained from Patient Recurrent left lateral foot ulcers. History of Present Illness (HPI) Very pleasant 81 year old with a past medical history significant for type 2 diabetes and lung cancer (status post chemoradiation). She has had a recurrent left diabetic foot ulcer for almost 10 years, which has healed in the past with compression therapy. This recently recurred in October 2015. No history of trauma. No significant pain. No symptoms to indicate ischemic rest pain or claudication. ABI 1.11. Ambulating per her baseline with a walker. Culture on 05/18/2014 grew methicillin sensitive staph aureus and staph lugdunensis, both sensitive to tetracycline. She was started on doxycycline but developed severe itching. She does not want anymore antibiotics. Punch biopsy on 06/29/2014 showed no evidence for malignancy. Consistent with stasis dermatitis. Underwent normal CVI ultrasound at [facility] in March 2016. Original ulcer has healed. She developed a second adjacent ulceration dorsally. Treating with regular debridements and 4 layer compression. Improved with epifix x8 (initially applied on 07/06/2014). She returns to clinic for follow-up and is without new complaints. No trauma. No significant pain. No fever or chills. No drainage. Objective Constitutional Pulse regular. Respirations normal and unlabored. Afebrile. Vitals Time Taken: 2:49 PM, Height: 63 in, Weight: 92 lbs, BMI: 16.3, Temperature: 98.1 F, Pulse: 100 bpm, Respiratory Rate: 20 breaths/min, Blood Pressure: 158/68 mmHg. DENAI, CABA TMarland Kitchen ([id number]) General Notes: Residual L foot ulcer is reepithelialized. No drainage. No cellulitis. No significant edema. Palpable DP. Integumentary (Hair, Skin) Wound #7 status is Healed - Epithelialized. Original cause of wound was Gradually Appeared. The wound is located on the Left,Dorsal  Foot. The wound measures 0cm length x 0cm width x 0cm depth; 0cm^2 area and 0cm^3 volume. 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 Plan Follow-up Appointments: Return Appointment in 2 weeks. - Recheck in two weeks Eucering cream. Compression stockings. RTC in 2 weeks for final wound check. Electronic Signature(s) Signed: 12/29/2014 9:32:06 PM By: Loletha Grayer MD Entered By: Loletha Grayer on 12/29/2014 21:06:15 Penny Clements ([name] ([id number]) -------------------------------------------------------------------------------- SuperBill Details Patient Name: Penny Clements Date of Service: 12/28/2014 Medical Record Number: [id number] Patient Account Number: [id number] Date of Birth/Sex: 16-May-1928 (81 y.o. 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

## 2014-12-29 NOTE — Progress Notes (Addendum)
JESELLE, HISER ([id number]) Visit Report for 12/28/2014 Arrival Information Details Patient Name: Penny Clements, Penny Clements Date of Service: 12/28/2014 2:30 PM Medical Record Number: [id number] Patient Account Number: [id number] Date of Birth/Sex: January 17, 1928 (81 y.o. Female) Treating RN: Cornell Barman Primary Care Physician: Deborra Medina Other Clinician: Referring Physician: Deborra Medina Treating Physician/Extender: BURNS III, Charlean Sanfilippo in Treatment: 9 Visit Information History Since Last Visit Added or deleted any medications: No Patient Arrived: Ambulatory Any new allergies or adverse reactions: No Arrival Time: 14:48 Had a fall or experienced change in No Accompanied By: self activities of daily living that may affect Transfer Assistance: None risk of falls: Patient Identification Verified: Yes Signs or symptoms of abuse/neglect since last No Secondary Verification Process Yes visito Completed: Hospitalized since last visit: No Patient Requires Transmission-Based No Has Dressing in Place as Prescribed: Yes Precautions: Pain Present Now: No Patient Has Alerts: Yes Electronic Signature(s) Signed: 12/28/2014 5:13:59 PM By: Gretta Cool, RN, BSN, Kim RN, BSN Entered By: Gretta Cool, RN, BSN, Kim on 12/28/2014 15:20:35 Penny Clements ([id number]) -------------------------------------------------------------------------------- Clinic Level of Care Assessment Details Patient Name: Penny Clements Date of Service: 12/28/2014 2:30 PM Medical Record Number: [id number] Patient Account Number: [id number] Date of Birth/Sex: January 23, 1928 (81 y.o. Female) Treating RN: Cornell Barman Primary Care Physician: Deborra Medina Other Clinician: Referring Physician: Deborra Medina Treating Physician/Extender: BURNS III, Charlean Sanfilippo in Treatment: 9 Clinic Level of Care Assessment Items TOOL 4 Quantity Score []  - Use when only an EandM is performed on FOLLOW-UP visit 0 ASSESSMENTS - Nursing Assessment /  Reassessment []  - Reassessment of Co-morbidities (includes updates in patient status) 0 X - Reassessment of Adherence to Treatment Plan 1 5 ASSESSMENTS - Wound and Skin Assessment / Reassessment X - Simple Wound Assessment / Reassessment - one wound 1 5 []  - Complex Wound Assessment / Reassessment - multiple wounds 0 []  - Dermatologic / Skin Assessment (not related to wound area) 0 ASSESSMENTS - Focused Assessment []  - Circumferential Edema Measurements - multi extremities 0 []  - Nutritional Assessment / Counseling / Intervention 0 []  - Lower Extremity Assessment (monofilament, tuning fork, pulses) 0 []  - Peripheral Arterial Disease Assessment (using hand held doppler) 0 ASSESSMENTS - Ostomy and/or Continence Assessment and Care []  - Incontinence Assessment and Management 0 []  - Ostomy Care Assessment and Management (repouching, etc.) 0 PROCESS - Coordination of Care X - Simple Patient / Family Education for ongoing care 1 15 []  - Complex (extensive) Patient / Family Education for ongoing care 0 []  - Staff obtains Programmer, systems, Records, Test Results / Process Orders 0 []  - Staff telephones HHA, Nursing Homes / Clarify orders / etc 0 []  - Routine Transfer to another Facility (non-emergent condition) 0 Mooney, Gurneet T. ([id number]) []  - Routine Hospital Admission (non-emergent condition) 0 []  - New Admissions / Biomedical engineer / Ordering NPWT, Apligraf, etc. 0 []  - Emergency Hospital Admission (emergent condition) 0 X - Simple Discharge Coordination 1 10 []  - Complex (extensive) Discharge Coordination 0 PROCESS - Special Needs []  - Pediatric / Minor Patient Management 0 []  - Isolation Patient Management 0 []  - Hearing / Language / Visual special needs 0 []  - Assessment of Community assistance (transportation, D/C planning, etc.) 0 []  - Additional assistance / Altered mentation 0 []  - Support Surface(s) Assessment (bed, cushion, seat, etc.) 0 INTERVENTIONS - Wound Cleansing /  Measurement X - Simple Wound Cleansing - one wound 1 5 []  - Complex Wound Cleansing - multiple wounds 0 X - Wound Imaging (photographs - any number  of wounds) 1 5 []  - Wound Tracing (instead of photographs) 0 X - Simple Wound Measurement - one wound 1 5 []  - Complex Wound Measurement - multiple wounds 0 INTERVENTIONS - Wound Dressings []  - Small Wound Dressing one or multiple wounds 0 []  - Medium Wound Dressing one or multiple wounds 0 []  - Large Wound Dressing one or multiple wounds 0 []  - Application of Medications - topical 0 []  - Application of Medications - injection 0 INTERVENTIONS - Miscellaneous []  - External ear exam 0 Schuman, Reene T. ([id number]) []  - Specimen Collection (cultures, biopsies, blood, body fluids, etc.) 0 []  - Specimen(s) / Culture(s) sent or taken to Lab for analysis 0 []  - Patient Transfer (multiple staff / Harrel Lemon Lift / Similar devices) 0 []  - Simple Staple / Suture removal (25 or less) 0 []  - Complex Staple / Suture removal (26 or more) 0 []  - Hypo / Hyperglycemic Management (close monitor of Blood Glucose) 0 []  - Ankle / Brachial Index (ABI) - do not check if billed separately 0 X - Vital Signs 1 5 Has the patient been seen at the hospital within the last three years: Yes Total Score: 55 Level Of Care: New/Established - Level 2 Electronic Signature(s) Signed: 12/28/2014 5:13:59 PM By: Gretta Cool, RN, BSN, Kim RN, BSN Entered By: Gretta Cool, RN, BSN, Kim on 12/28/2014 15:17:17 Penny Clements ([id number]) -------------------------------------------------------------------------------- Encounter Discharge Information Details Patient Name: Penny Clements Date of Service: 12/28/2014 2:30 PM Medical Record Number: [id number] Patient Account Number: [id number] Date of Birth/Sex: [birth year] (81 y.o. Female) Treating RN: Cornell Barman Primary Care Physician: Deborra Medina Other Clinician: Referring Physician: Deborra Medina Treating Physician/Extender: BURNS III,  Charlean Sanfilippo in Treatment: 9 Encounter Discharge Information Items Discharge Pain Level: 0 Discharge Condition: Stable Ambulatory Status: Walker Discharge Destination: Home Private Transportation: Auto Accompanied By: self Schedule Follow-up Appointment: Yes Medication Reconciliation completed and Yes provided to Patient/Care Jermie Hippe: Clinical Summary of Care: Electronic Signature(s) Signed: 12/28/2014 5:13:59 PM By: Gretta Cool RN, BSN, Kim RN, BSN Entered By: Gretta Cool, RN, BSN, Kim on 12/28/2014 15:17:38 Penny Clements ([id number]) -------------------------------------------------------------------------------- Lower Extremity Assessment Details Patient Name: Penny Clements Date of Service: 12/28/2014 2:30 PM Medical Record Number: [id number] Patient Account Number: [id number] Date of Birth/Sex: 1928-05-06 (81 y.o. Female) Treating RN: Cornell Barman Primary Care Physician: Deborra Medina Other Clinician: Referring Physician: Deborra Medina Treating Physician/Extender: BURNS III, Charlean Sanfilippo in Treatment: 9 Edema Assessment Assessed: [Left: No] [Right: No] E[Left: dema] [Right: :] Calf Left: Right: Point of Measurement: 33 cm From Medial Instep 26 cm cm Ankle Left: Right: Point of Measurement: 10 cm From Medial Instep 18.8 cm cm Vascular Assessment Pulses: Posterior Tibial Dorsalis Pedis Palpable: [Left:Yes] Extremity colors, hair growth, and conditions: Extremity Color: [Left:Mottled] Hair Growth on Extremity: [Left:No] Temperature of Extremity: [Left:Cool] Capillary Refill: [Left:< 3 seconds] Toe Nail Assessment Left: Right: Thick: Yes Discolored: Yes Deformed: Yes Improper Length and Hygiene: Yes Electronic Signature(s) Signed: 12/28/2014 5:13:59 PM By: Gretta Cool, RN, BSN, Kim RN, BSN Entered By: Gretta Cool, RN, BSN, Kim on 12/28/2014 14:59:49 Penny Clements ([id number]) -------------------------------------------------------------------------------- Multi Wound  Chart Details Patient Name: Penny Clements Date of Service: 12/28/2014 2:30 PM Medical Record Number: [id number] Patient Account Number: [id number] Date of Birth/Sex: 05/04/1928 (81 y.o. 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] Date Acquired: [7:09/20/2014] [N/A:N/A] Weeks of Treatment: [7:14] [N/A:N/A] Wound Status: [7:Healed - Epithelialized] [N/A:N/A] Measurements L x W x D 0x0x0 [N/A:N/A] (cm) Area (cm) : [7:0] [N/A:N/A] Volume (cm) : [7:0] [N/A:N/A] % Reduction in Area: [7:100.00%] [N/A:N/A] % Reduction in Volume: 100.00% [N/A:N/A] Classification: [7:Full Thickness Without Exposed Support Structures] [N/A:N/A] Periwound Skin Texture: No Abnormalities Noted N/A Periwound Skin [7:No Abnormalities Noted N/A] Moisture: Periwound Skin Color: No Abnormalities Noted N/A Tenderness on [7:No] [N/A:N/A] Treatment Notes Electronic Signature(s) Signed: 12/28/2014 5:13:59 PM By: Gretta Cool, RN, BSN, Kim RN, BSN Entered By: Gretta Cool, RN, BSN, Kim on 12/28/2014 15:16:15 Penny Clements ([name] ([id number]) -------------------------------------------------------------------------------- [place] Details Patient Name: Penny Clements Date of Service: 12/28/2014 2:30 PM Medical Record Number: [id number] Patient Account Number: [id number] Date of Birth/Sex: [birth year] (81 y.o. Female) Treating RN: Cornell Barman Primary Care Physician: Deborra Medina Other Clinician: Referring Physician: Deborra Medina Treating Physician/Extender: BURNS III, Charlean Sanfilippo in Treatment: 9 Active Inactive Abuse / Safety / Falls / Self Care Management Nursing Diagnoses: Potential for falls Goals: Patient will remain injury free Date Initiated: 04/07/2014 Goal Status: Active Interventions: Assess fall risk on admission and as needed Notes: Nutrition Nursing Diagnoses: Impaired glucose control: actual or potential Goals: Patient/caregiver agrees to and verbalizes understanding of need to use nutritional supplements and/or vitamins as prescribed Date Initiated: 04/07/2014 Goal Status: Active Interventions: Assess patient nutrition upon admission and as needed per policy Provide education on nutrition Notes: Orientation to the Wound Care Program Nursing Diagnoses: Knowledge deficit related to the wound healing center program Goals: AMORITA, VANROSSUM ([id number]) Patient/caregiver will verbalize understanding of the [facility] [facility] Date Initiated: 04/07/2014 Goal Status: Active Interventions: Provide education on orientation to the wound center Notes: Wound/Skin Impairment Nursing Diagnoses: Impaired tissue integrity Goals: Patient/caregiver will verbalize understanding of skin care regimen Date Initiated: 04/07/2014 Goal Status: Active Ulcer/skin breakdown will heal within 14 weeks Date Initiated: 04/07/2014 Goal Status: Active Interventions: Assess patient/caregiver ability to obtain necessary supplies Notes: Electronic Signature(s) Signed: 12/28/2014 5:13:59 PM By: Gretta Cool, RN, BSN, Kim RN, BSN Entered By: Gretta Cool, RN, BSN, Kim on 12/28/2014 15:16:10 Penny Clements ([id number]) -------------------------------------------------------------------------------- Patient/Caregiver Education Details Patient Name: Penny Clements Date of Service: 12/28/2014 2:30 PM Medical Record Number: [id number] Patient Account  Number: [id number] Date of Birth/Gender: [birth year] (81 y.o. Female) Treating RN: Cornell Barman Primary Care Physician: Deborra Medina Other Clinician: Referring Physician: Deborra Medina Treating Physician/Extender: BURNS III, Charlean Sanfilippo in Treatment: 9 Education Assessment Education Provided To: Patient Education Topics Provided Wound/Skin Impairment: Handouts: Caring for Your Ulcer, Other: lotion to driy skin Electronic Signature(s) Signed: 12/28/2014 5:13:59 PM By: Gretta Cool, RN, BSN, Kim RN, BSN Entered By: Gretta Cool, RN, BSN, Kim on 12/28/2014 15:17:58 Penny Clements ([id number]) -------------------------------------------------------------------------------- Wound Assessment Details Patient Name: Penny Clements Date of Service: 12/28/2014 2:30 PM Medical Record Number: [id number] Patient Account Number: [id number] Date of Birth/Sex: August 13, 1927 (81 y.o. Female) Treating RN: Cornell Barman Primary Care Physician: Deborra Medina Other Clinician: Referring Physician: Deborra Medina Treating Physician/Extender: BURNS III, Charlean Sanfilippo in Treatment: 9 Wound Status Wound Number: 7 Primary Etiology: Arterial Insufficiency Ulcer Wound Location: Left, Dorsal Foot Wound Status: Healed - Epithelialized Wounding Event: Gradually Appeared Date Acquired: 09/20/2014 Weeks Of Treatment: 14 Clustered Wound: No Photos Photo Uploaded By: Gretta Cool, RN, BSN, Kim on 12/28/2014 16:53:20 Wound Measurements Length: (  cm) 0 % Reducti Width: (cm) 0 % Reducti Depth: (cm) 0 Area: (cm) 0 Volume: (cm) 0 on in Area: 100% on in Volume: 100% Wound Description Full Thickness Without Exposed Classification: Support Structures Periwound Skin Texture Texture Color No Abnormalities Noted: No No Abnormalities Noted: No Moisture No Abnormalities Noted: No Electronic Signature(s) Signed: 12/28/2014 5:13:59 PM By: Gretta Cool, RN, BSN, Kim RN, BSN [place], Louine TMarland Kitchen ([id number]) Entered By: Gretta Cool, RN, BSN, Kim on  12/28/2014 15:15:31 Penny Clements ([id number]) -------------------------------------------------------------------------------- [place] Details Patient Name: Penny Clements Date of Service: 12/28/2014 2:30 PM Medical Record Number: [id number] Patient Account Number: [id number] Date of Birth/Sex: Mar 16, 1928 (81 y.o. Female) Treating RN: Cornell Barman Primary Care Physician: Deborra Medina Other Clinician: Referring Physician: Deborra Medina Treating Physician/Extender: BURNS III, Charlean Sanfilippo in Treatment: 9 Vital Signs Time Taken: 14:49 Temperature (F): 98.1 Height (in): 63 Pulse (bpm): 100 Weight (lbs): 92 Respiratory Rate (breaths/min): 20 Body Mass Index (BMI): 16.3 Blood Pressure (mmHg): 158/68 Reference Range: 80 - 120 mg / dl Electronic Signature(s) Signed: 12/28/2014 5:13:59 PM By: Gretta Cool, RN, BSN, Kim RN, BSN Entered By: Gretta Cool, RN, BSN, Kim on 12/28/2014 14:51:44

## 2015-01-04 ENCOUNTER — Encounter: Payer: Medicare Other | Admitting: Surgery

## 2015-01-08 ENCOUNTER — Encounter: Payer: Self-pay | Admitting: Internal Medicine

## 2015-01-09 ENCOUNTER — Other Ambulatory Visit: Payer: Self-pay | Admitting: Internal Medicine

## 2015-01-11 ENCOUNTER — Encounter: Payer: Medicare Other | Admitting: Surgery

## 2015-01-11 DIAGNOSIS — L97529 Non-pressure chronic ulcer of other part of left foot with unspecified severity: Secondary | ICD-10-CM | POA: Diagnosis not present

## 2015-01-12 NOTE — Progress Notes (Signed)
Penny Clements ([id number]) Visit Report for 01/11/2015 Arrival Information Details Patient Name: Penny Clements, Penny Clements Date of Service: 01/11/2015 2:30 PM Medical Record Number: [id number] Patient Account Number: [id number] Date of Birth/Sex: 05/11/1928 (81 y.o. Female) Treating RN: Montey Hora Primary Care Physician: Deborra Medina Other Clinician: Referring Physician: Deborra Medina Treating Physician/Extender: BURNS III, Charlean Sanfilippo in Treatment: 11 Visit Information History Since Last Visit Added or deleted any medications: No Patient Arrived: Walker Any new allergies or adverse reactions: No Arrival Time: 14:44 Had a fall or experienced change in No Accompanied By: self activities of daily living that may affect Transfer Assistance: None risk of falls: Patient Identification Verified: Yes Signs or symptoms of abuse/neglect since last No Secondary Verification Process Completed: Yes visito Patient Requires Transmission-Based No Hospitalized since last visit: No Precautions: Pain Present Now: No Patient Has Alerts: Yes Electronic Signature(s) Signed: 01/11/2015 4:46:01 PM By: Montey Hora Entered By: Montey Hora on 01/11/2015 14:44:58 Penny Clements ([id number]) -------------------------------------------------------------------------------- Clinic Level of Care Assessment Details Patient Name: Penny Clements Date of Service: 01/11/2015 2:30 PM Medical Record Number: [id number] Patient Account Number: [id number] Date of Birth/Sex: [birth year] (81 y.o. Female) Treating RN: Montey Hora Primary Care Physician: Deborra Medina Other Clinician: Referring Physician: Deborra Medina Treating Physician/Extender: BURNS III, Charlean Sanfilippo in Treatment: 11 Clinic Level of Care Assessment Items TOOL 4 Quantity Score []  - Use when only an EandM is performed on FOLLOW-UP visit 0 ASSESSMENTS - Nursing Assessment / Reassessment X - Reassessment of Co-morbidities (includes  updates in patient status) 1 10 X - Reassessment of Adherence to Treatment Plan 1 5 ASSESSMENTS - Wound and Skin Assessment / Reassessment []  - Simple Wound Assessment / Reassessment - one wound 0 []  - Complex Wound Assessment / Reassessment - multiple wounds 0 X - Dermatologic / Skin Assessment (not related to wound area) 1 10 ASSESSMENTS - Focused Assessment X - Circumferential Edema Measurements - multi extremities 1 5 []  - Nutritional Assessment / Counseling / Intervention 0 X - Lower Extremity Assessment (monofilament, tuning fork, pulses) 1 5 []  - Peripheral Arterial Disease Assessment (using hand held doppler) 0 ASSESSMENTS - Ostomy and/or Continence Assessment and Care []  - Incontinence Assessment and Management 0 []  - Ostomy Care Assessment and Management (repouching, etc.) 0 PROCESS - Coordination of Care X - Simple Patient / Family Education for ongoing care 1 15 []  - Complex (extensive) Patient / Family Education for ongoing care 0 []  - Staff obtains Programmer, systems, Records, Test Results / Process Orders 0 []  - Staff telephones HHA, Nursing Homes / Clarify orders / etc 0 []  - Routine Transfer to another Facility (non-emergent condition) 0 Casagrande, Jenie T. ([id number]) []  - Routine Hospital Admission (non-emergent condition) 0 []  - New Admissions / Biomedical engineer / Ordering NPWT, Apligraf, etc. 0 []  - Emergency Hospital Admission (emergent condition) 0 X - Simple Discharge Coordination 1 10 []  - Complex (extensive) Discharge Coordination 0 PROCESS - Special Needs []  - Pediatric / Minor Patient Management 0 []  - Isolation Patient Management 0 []  - Hearing / Language / Visual special needs 0 []  - Assessment of Community assistance (transportation, D/C planning, etc.) 0 []  - Additional assistance / Altered mentation 0 []  - Support Surface(s) Assessment (bed, cushion, seat, etc.) 0 INTERVENTIONS - Wound Cleansing / Measurement []  - Simple Wound Cleansing - one wound  0 []  - Complex Wound Cleansing - multiple wounds 0 []  - Wound Imaging (photographs - any number of wounds) 0 []  - Wound Tracing (instead of photographs) 0 []  -  Simple Wound Measurement - one wound 0 []  - Complex Wound Measurement - multiple wounds 0 INTERVENTIONS - Wound Dressings []  - Small Wound Dressing one or multiple wounds 0 []  - Medium Wound Dressing one or multiple wounds 0 []  - Large Wound Dressing one or multiple wounds 0 []  - Application of Medications - topical 0 []  - Application of Medications - injection 0 INTERVENTIONS - Miscellaneous []  - External ear exam 0 Mcleary, Lucero T. ([id number]) []  - Specimen Collection (cultures, biopsies, blood, body fluids, etc.) 0 []  - Specimen(s) / Culture(s) sent or taken to Lab for analysis 0 []  - Patient Transfer (multiple staff / Harrel Lemon Lift / Similar devices) 0 []  - Simple Staple / Suture removal (25 or less) 0 []  - Complex Staple / Suture removal (26 or more) 0 []  - Hypo / Hyperglycemic Management (close monitor of Blood Glucose) 0 []  - Ankle / Brachial Index (ABI) - do not check if billed separately 0 X - Vital Signs 1 5 Has the patient been seen at the hospital within the last three years: Yes Total Score: 65 Level Of Care: New/Established - Level 2 Electronic Signature(s) Signed: 01/11/2015 4:46:01 PM By: Montey Hora Entered By: Montey Hora on 01/11/2015 15:37:20 Penny Clements ([id number]) -------------------------------------------------------------------------------- Encounter Discharge Information Details Patient Name: Penny Clements Date of Service: 01/11/2015 2:30 PM Medical Record Number: [id number] Patient Account Number: [id number] Date of Birth/Sex: [birth year] (81 y.o. Female) Treating RN: Montey Hora Primary Care Physician: Deborra Medina Other Clinician: Referring Physician: Deborra Medina Treating Physician/Extender: BURNS III, Charlean Sanfilippo in Treatment: 11 Encounter Discharge Information  Items Discharge Pain Level: 0 Discharge Condition: Stable Ambulatory Status: Walker Discharge Destination: Home Private Transportation: Auto Accompanied By: self Schedule Follow-up Appointment: Yes Medication Reconciliation completed and No provided to Patient/Care Falisha Osment: Clinical Summary of Care: Electronic Signature(s) Signed: 01/11/2015 4:46:01 PM By: Montey Hora Entered By: Montey Hora on 01/11/2015 15:02:43 Penny Clements ([id number]) -------------------------------------------------------------------------------- Lower Extremity Assessment Details Patient Name: Penny Clements Date of Service: 01/11/2015 2:30 PM Medical Record Number: [id number] Patient Account Number: [id number] Date of Birth/Sex: 10/05/1927 (81 y.o. Female) Treating RN: Montey Hora Primary Care Physician: Deborra Medina Other Clinician: Referring Physician: Deborra Medina Treating Physician/Extender: BURNS III, Charlean Sanfilippo in Treatment: 11 Edema Assessment Assessed: [Left: No] [Right: No] E[Left: dema] [Right: :] Calf Left: Right: Point of Measurement: 33 cm From Medial Instep 31.6 cm cm Ankle Left: Right: Point of Measurement: 10 cm From Medial Instep 20 cm cm Vascular Assessment Pulses: Posterior Tibial Dorsalis Pedis Palpable: [Left:Yes] Extremity colors, hair growth, and conditions: Extremity Color: [Left:Normal] Hair Growth on Extremity: [Left:No] Temperature of Extremity: [Left:Warm] Capillary Refill: [Left:< 3 seconds] Toe Nail Assessment Left: Right: Thick: Yes Discolored: Yes Deformed: Yes Improper Length and Hygiene: Yes Electronic Signature(s) Signed: 01/11/2015 4:46:01 PM By: Montey Hora Entered By: Montey Hora on 01/11/2015 14:49:46 Penny Clements ([id number]) -------------------------------------------------------------------------------- Multi Wound Chart Details Patient Name: Penny Clements Date of Service: 01/11/2015 2:30 PM Medical  Record Number: [id number] Patient Account Number: [id number] Date of Birth/Sex: [birth year] (81 y.o. Female) Treating RN: Montey Hora Primary Care Physician: Deborra Medina Other Clinician: Referring Physician: Deborra Medina Treating Physician/Extender: BURNS III, Charlean Sanfilippo in Treatment: 11 Vital Signs Height(in): 63 Pulse(bpm): 106 Weight(lbs): 92 Blood Pressure 153/51 (mmHg): Body Mass Index(BMI): 16 Temperature(F): 98.3 Respiratory Rate 20 (breaths/min): Wound Assessments Treatment Notes Electronic Signature(s) Signed: 01/11/2015 4:46:01 PM By: Montey Hora Entered By: Montey Hora on 01/11/2015 14:56:43 Penny Clements ([id number]) -------------------------------------------------------------------------------- Multi-Disciplinary Care Plan Details  Patient Name: ALAIAH, LUNDY Date of Service: 01/11/2015 2:30 PM Medical Record Number: [id number] Patient Account Number: [id number] Date of Birth/Sex: 1928/04/28 (81 y.o. Female) Treating RN: Montey Hora Primary Care Physician: Deborra Medina Other Clinician: Referring Physician: Deborra Medina Treating Physician/Extender: BURNS III, Charlean Sanfilippo in Treatment: 11 Active Inactive Abuse / Safety / Falls / Self Care Management Nursing Diagnoses: Potential for falls Goals: Patient will remain injury free Date Initiated: 04/07/2014 Goal Status: Active Interventions: Assess fall risk on admission and as needed Notes: Nutrition Nursing Diagnoses: Impaired glucose control: actual or potential Goals: Patient/caregiver agrees to and verbalizes understanding of need to use nutritional supplements and/or vitamins as prescribed Date Initiated: 04/07/2014 Goal Status: Active Interventions: Assess patient nutrition upon admission and as needed per policy Provide education on nutrition Notes: Electronic Signature(s) Signed: 01/11/2015 4:46:01 PM By: Montey Hora Entered By: Montey Hora on 01/11/2015  14:56:27 Penny Clements ([id number]) -------------------------------------------------------------------------------- Patient/Caregiver Education Details Patient Name: Penny Clements Date of Service: 01/11/2015 2:30 PM Medical Record Number: [id number] Patient Account Number: [id number] Date of Birth/Gender: 10-31-1927 (81 y.o. Female) Treating RN: Montey Hora Primary Care Physician: Deborra Medina Other Clinician: Referring Physician: Deborra Medina Treating Physician/Extender: BURNS III, Charlean Sanfilippo in Treatment: 11 Education Assessment Education Provided To: Patient Education Topics Provided Wound/Skin Impairment: Handouts: Other: ointment use twice daily Methods: Demonstration, Explain/Verbal Responses: State content correctly Electronic Signature(s) Signed: 01/11/2015 4:46:01 PM By: Montey Hora Entered By: Montey Hora on 01/11/2015 15:15:25 Penny Clements ([id number]) -------------------------------------------------------------------------------- [place] Details Patient Name: Penny Clements Date of Service: 01/11/2015 2:30 PM Medical Record Number: [id number] Patient Account Number: [id number] Date of Birth/Sex: 06/27/1927 (81 y.o. Female) Treating RN: Montey Hora Primary Care Physician: Deborra Medina Other Clinician: Referring Physician: Deborra Medina Treating Physician/Extender: BURNS III, Charlean Sanfilippo in Treatment: 11 Vital Signs Time Taken: 14:46 Temperature (F): 98.3 Height (in): 63 Pulse (bpm): 106 Weight (lbs): 92 Respiratory Rate (breaths/min): 20 Body Mass Index (BMI): 16.3 Blood Pressure (mmHg): 153/51 Reference Range: 80 - 120 mg / dl Electronic Signature(s) Signed: 01/11/2015 4:46:01 PM By: Montey Hora Entered By: Montey Hora on 01/11/2015 14:47:18

## 2015-01-12 NOTE — Progress Notes (Signed)
Penny, Clements ([id number]) Visit Report for 01/11/2015 Chief Complaint Document Details Patient Name: Penny, Clements Date of Service: 01/11/2015 2:30 PM Medical Record Number: [id number] Patient Account Number: [id number] Date of Birth/Sex: Apr 11, 1928 (81 y.o. Female) Treating RN: Primary Care Physician: Deborra Medina Other Clinician: Referring Physician: Deborra Medina Treating Physician/Extender: BURNS III, Charlean Sanfilippo in Treatment: 11 Information Obtained from: Patient Chief Complaint Chronic left lateral foot ulcers (healed). Stasis dermatitis. Electronic Signature(s) Signed: 01/11/2015 4:18:55 PM By: Loletha Grayer MD Entered By: Loletha Grayer on 01/11/2015 15:03:34 Penny Clements ([id number]) -------------------------------------------------------------------------------- HPI Details Patient Name: Penny Clements Date of Service: 01/11/2015 2:30 PM Medical Record Number: [id number] Patient Account Number: [id number] Date of Birth/Sex: Jul 26, 1927 (81 y.o. Female) Treating RN: Primary Care Physician: Deborra Medina Other Clinician: Referring Physician: Deborra Medina Treating Physician/Extender: BURNS III, Charlean Sanfilippo in Treatment: 11 History of Present Illness HPI Description: Very pleasant 81 year old with a past medical history significant for type 2 diabetes and lung cancer (status post chemoradiation). She has had a recurrent left diabetic foot ulcer for almost 10 years, which has healed in the past with compression therapy. This recently recurred in October 2015. No history of trauma. No significant pain. No symptoms to indicate ischemic rest pain or claudication. ABI 1.11. Ambulating per her baseline with a walker. Culture on 05/18/2014 grew methicillin sensitive staph aureus and staph lugdunensis, both sensitive to tetracycline. She was started on doxycycline but developed severe itching. She does not want anymore antibiotics. Punch biopsy on  06/29/2014 showed no evidence for malignancy. Consistent with stasis dermatitis. Underwent normal CVI ultrasound at [facility] in March 2016. Original ulcer has healed. She developed a second adjacent ulceration dorsally. Treated with regular debridements, silver/collagen and 4 layer compression. Improved with epifix x8 (initially applied on 07/06/2014). Remaining ulcer healed at her last clinic visit. Applying Eucerin cream and compression stockings. She returns to clinic for follow-up and is without new complaints. No trauma. No significant pain. No fever or chills. No drainage. Electronic Signature(s) Signed: 01/11/2015 4:18:55 PM By: Loletha Grayer MD Entered By: Loletha Grayer on 01/11/2015 15:05:02 Penny Clements ([id number]) -------------------------------------------------------------------------------- Physical Exam Details Patient Name: Penny Clements Date of Service: 01/11/2015 2:30 PM Medical Record Number: [id number] Patient Account Number: [id number] Date of Birth/Sex: 1927-11-29 (81 y.o. Female) Treating RN: Primary Care Physician: Deborra Medina Other Clinician: Referring Physician: Deborra Medina Treating Physician/Extender: BURNS III, WALTER Weeks in Treatment: 11 Constitutional . Pulse regular. Respirations normal and unlabored. Afebrile. Marland Kitchen Respiratory WNL. No retractions.. Cardiovascular Pedal Pulses WNL. Integumentary (Hair, Skin) .Marland Kitchen Neurological Sensation normal to touch, pin,and vibration. Psychiatric Judgement and insight Intact.. Oriented times 3.. No evidence of depression, anxiety, or agitation.. Notes Left dorsal foot ulcerations remain healed. No drainage. No cellulitis. She appears to have new areas of stasis dermatitis involving the left forefoot, dorsal foot (at the site of her previous ulcerations), and lateral heel. Palpable DP. Electronic Signature(s) Signed: 01/11/2015 4:18:55 PM By: Loletha Grayer MD Entered  By: Loletha Grayer on 01/11/2015 15:06:09 Penny Clements ([id number]) -------------------------------------------------------------------------------- Physician Orders Details Patient Name: Penny Clements Date of Service: 01/11/2015 2:30 PM Medical Record Number: [id number] Patient Account Number: [id number] Date of Birth/Sex: 15-Mar-1928 (81 y.o. Female) Treating RN: Montey Hora Primary Care Physician: Deborra Medina Other Clinician: Referring Physician: Deborra Medina Treating Physician/Extender: BURNS III, Charlean Sanfilippo in Treatment: 11 Verbal / Phone Orders: Yes Clinician: Montey Hora Read Back and Verified: Yes Diagnosis Coding Follow-up Appointments o Return  Appointment in 2 weeks. - Recheck in two weeks Medications-please add to medication list. o Other: - triamcinolone cream to left foot daily Electronic Signature(s) Signed: 01/11/2015 4:18:55 PM By: Loletha Grayer MD Signed: 01/11/2015 4:46:01 PM By: Montey Hora Entered By: Montey Hora on 01/11/2015 14:57:35 Penny Clements ([id number]) -------------------------------------------------------------------------------- Problem List Details Patient Name: Penny Clements Date of Service: 01/11/2015 2:30 PM Medical Record Number: [id number] Patient Account Number: [id number] Date of Birth/Sex: [birth year] (81 y.o. 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 01/11/2015 Yes Inactive Problems Resolved Problems ICD-10 Code Description Active Date Resolved Date E11.621 Type 2 diabetes mellitus with foot ulcer 04/07/2014 V37.106 Varicose veins of  left lower extremity with both ulcer of 07/06/2014 ankle and inflammation Electronic Signature(s) Signed: 01/11/2015 4:18:55 PM By: Loletha Grayer MD Entered By: Loletha Grayer on 01/11/2015 15:07:16 Penny Clements ([id number]) -------------------------------------------------------------------------------- Progress Note Details Patient Name: Penny Clements Date of Service: 01/11/2015 2:30 PM Medical Record Number: [id number] Patient Account Number: [id number] Date of Birth/Sex: 08-30-1927 (81 y.o. Female) Treating RN: Primary Care Physician: Deborra Medina Other Clinician: Referring Physician: Deborra Medina Treating Physician/Extender: BURNS III, Charlean Sanfilippo in Treatment: 11 Subjective Chief Complaint Information obtained from Patient Chronic left lateral foot ulcers (healed). Stasis dermatitis. History of Present Illness (HPI) Very pleasant 81 year old with a past medical history significant for type 2 diabetes and lung cancer (status post chemoradiation). She has had a recurrent left diabetic foot ulcer for almost 10 years, which has healed in the past with compression therapy. This recently recurred in October 2015. No history of trauma. No significant pain. No symptoms to indicate ischemic rest pain or claudication. ABI 1.11. Ambulating per her baseline with a walker. Culture on 05/18/2014 grew methicillin sensitive staph aureus and staph lugdunensis, both sensitive to tetracycline. She was started on doxycycline but developed severe itching. She does not want anymore antibiotics. Punch biopsy on 06/29/2014 showed no evidence for malignancy. Consistent with stasis dermatitis. Underwent normal CVI ultrasound at [facility] in March 2016. Original ulcer has healed. She developed a second adjacent ulceration dorsally. Treated with regular debridements, silver/collagen and 4 layer compression. Improved with epifix x8 (initially applied on  07/06/2014). Remaining ulcer healed at her last clinic visit. Applying Eucerin cream and compression stockings. She returns to clinic for follow-up and is without new complaints. No trauma. No significant pain. No fever or chills. No drainage. Objective Constitutional Pulse regular. Respirations normal and unlabored. Afebrile. Vitals Time Taken: 2:46 PM, Height: 63 in, Weight: 92 lbs, BMI: 16.3, Temperature: 98.3 F, Pulse: 106 bpm, Respiratory Rate: 20 breaths/min, Blood Pressure: 153/51 mmHg. Penny, BARILLAS TMarland Kitchen ([id number]) Respiratory WNL. No retractions.. Cardiovascular Pedal Pulses WNL. Neurological Sensation normal to touch, pin,and vibration. Psychiatric Judgement and insight Intact.. Oriented times 3.. No evidence of depression, anxiety, or agitation.. General Notes: Left dorsal foot ulcerations remain healed. No drainage. No cellulitis. She appears to have new areas of stasis dermatitis involving the left forefoot, dorsal foot (at the site of her previous ulcerations), and lateral heel. Palpable DP. Integumentary (Hair, Skin) Assessment Active Problems ICD-10 E11.40 - Type 2 diabetes mellitus with diabetic neuropathy, unspecified E11.620 - Type 2 diabetes mellitus with diabetic dermatitis Healed, chronic left diabetic foot ulcerations. New-onset stasis  dermatitis. Plan Follow-up Appointments: Return Appointment in 2 weeks. - Recheck in two weeks Medications-please add to medication list.: Other: - triamcinolone cream to left foot daily Clements, Penny Clements. ([id number]) Triamcinolone cream. Compression stockings. Return to clinic in 2 weeks. Electronic Signature(s) Signed: 01/11/2015 4:18:55 PM By: Loletha Grayer MD Entered By: Loletha Grayer on 01/11/2015 15:08:01 Penny Clements ([id number]) -------------------------------------------------------------------------------- SuperBill Details Patient Name: Penny Clements Date of Service: 01/11/2015 Medical  Record Number: [id number] Patient Account Number: [id number] Date of Birth/Sex: 08-20-1927 (81 y.o. 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

## 2015-01-13 NOTE — Telephone Encounter (Signed)
Sent unread mychart  To patient via mail

## 2015-01-18 ENCOUNTER — Encounter: Payer: Medicare Other | Admitting: Surgery

## 2015-01-25 ENCOUNTER — Encounter: Payer: Medicare Other | Attending: Surgery | Admitting: Surgery

## 2015-01-25 DIAGNOSIS — R2242 Localized swelling, mass and lump, left lower limb: Secondary | ICD-10-CM | POA: Diagnosis not present

## 2015-01-25 DIAGNOSIS — E1140 Type 2 diabetes mellitus with diabetic neuropathy, unspecified: Secondary | ICD-10-CM | POA: Diagnosis not present

## 2015-01-25 DIAGNOSIS — E11620 Type 2 diabetes mellitus with diabetic dermatitis: Secondary | ICD-10-CM | POA: Insufficient documentation

## 2015-01-25 DIAGNOSIS — I872 Venous insufficiency (chronic) (peripheral): Secondary | ICD-10-CM | POA: Insufficient documentation

## 2015-01-25 NOTE — Progress Notes (Addendum)
AVRIANNA, SMART ([id number]) Visit Report for 01/25/2015 Arrival Information Details Patient Name: Penny Clements, Penny Clements Date of Service: 01/25/2015 3:00 PM Medical Record Number: [id number] Patient Account Number: [id number] Date of Birth/Sex: 1927/11/27 (81 y.o. Female) Treating RN: Afful, RN, BSN, Velva Harman Primary Care Physician: Deborra Medina Other Clinician: Referring Physician: Deborra Medina Treating Physician/Extender: BURNS III, Charlean Sanfilippo in Treatment: 13 Visit Information History Since Last Visit Any new allergies or adverse reactions: No Patient Arrived: Gilford Rile Had a fall or experienced change in No Arrival Time: 14:53 activities of daily living that may affect Accompanied By: self risk of falls: Transfer Assistance: None Signs or symptoms of abuse/neglect since last No Patient Identification Verified: Yes visito Secondary Verification Process Completed: Yes Hospitalized since last visit: No Patient Requires Transmission-Based No Pain Present Now: No Precautions: Patient Has Alerts: Yes Electronic Signature(s) Signed: 01/25/2015 2:54:05 PM By: Regan Lemming BSN, RN Entered By: Regan Lemming on 01/25/2015 14:54:05 Penny Clements ([id number]) -------------------------------------------------------------------------------- Clinic Level of Care Assessment Details Patient Name: Penny Clements Date of Service: 01/25/2015 3:00 PM Medical Record Number: [id number] Patient Account Number: [id number] Date of Birth/Sex: [birth year] (81 y.o. Female) Treating RN: Afful, RN, BSN, [place] Primary Care Physician: Deborra Medina Other Clinician: Referring Physician: Deborra Medina Treating Physician/Extender: BURNS III, Charlean Sanfilippo in Treatment: 13 Clinic Level of Care Assessment Items TOOL 4 Quantity Score []  - Use when only an EandM is performed on FOLLOW-UP visit 0 ASSESSMENTS - Nursing Assessment / Reassessment X - Reassessment of Co-morbidities (includes updates in patient status)  1 10 X - Reassessment of Adherence to Treatment Plan 1 5 ASSESSMENTS - Wound and Skin Assessment / Reassessment []  - Simple Wound Assessment / Reassessment - one wound 0 []  - Complex Wound Assessment / Reassessment - multiple wounds 0 X - Dermatologic / Skin Assessment (not related to wound area) 1 10 ASSESSMENTS - Focused Assessment []  - Circumferential Edema Measurements - multi extremities 0 []  - Nutritional Assessment / Counseling / Intervention 0 X - Lower Extremity Assessment (monofilament, tuning fork, pulses) 1 5 []  - Peripheral Arterial Disease Assessment (using hand held doppler) 0 ASSESSMENTS - Ostomy and/or Continence Assessment and Care []  - Incontinence Assessment and Management 0 []  - Ostomy Care Assessment and Management (repouching, etc.) 0 PROCESS - Coordination of Care X - Simple Patient / Family Education for ongoing care 1 15 []  - Complex (extensive) Patient / Family Education for ongoing care 0 []  - Staff obtains Programmer, systems, Records, Test Results / Process Orders 0 []  - Staff telephones HHA, Nursing Homes / Clarify orders / etc 0 []  - Routine Transfer to another Facility (non-emergent condition) 0 Tellez, Alana T. ([id number]) []  - Routine Hospital Admission (non-emergent condition) 0 []  - New Admissions / Biomedical engineer / Ordering NPWT, Apligraf, etc. 0 []  - Emergency Hospital Admission (emergent condition) 0 X - Simple Discharge Coordination 1 10 []  - Complex (extensive) Discharge Coordination 0 PROCESS - Special Needs []  - Pediatric / Minor Patient Management 0 []  - Isolation Patient Management 0 []  - Hearing / Language / Visual special needs 0 []  - Assessment of Community assistance (transportation, D/C planning, etc.) 0 []  - Additional assistance / Altered mentation 0 []  - Support Surface(s) Assessment (bed, cushion, seat, etc.) 0 INTERVENTIONS - Wound Cleansing / Measurement []  - Simple Wound Cleansing - one wound 0 []  - Complex Wound Cleansing  - multiple wounds 0 []  - Wound Imaging (photographs - any number of wounds) 0 []  - Wound Tracing (instead of photographs) 0 []  -  Simple Wound Measurement - one wound 0 []  - Complex Wound Measurement - multiple wounds 0 INTERVENTIONS - Wound Dressings []  - Small Wound Dressing one or multiple wounds 0 []  - Medium Wound Dressing one or multiple wounds 0 []  - Large Wound Dressing one or multiple wounds 0 []  - Application of Medications - topical 0 []  - Application of Medications - injection 0 INTERVENTIONS - Miscellaneous []  - External ear exam 0 Porta, Minh T. ([id number]) []  - Specimen Collection (cultures, biopsies, blood, body fluids, etc.) 0 []  - Specimen(s) / Culture(s) sent or taken to Lab for analysis 0 []  - Patient Transfer (multiple staff / Harrel Lemon Lift / Similar devices) 0 []  - Simple Staple / Suture removal (25 or less) 0 []  - Complex Staple / Suture removal (26 or more) 0 []  - Hypo / Hyperglycemic Management (close monitor of Blood Glucose) 0 []  - Ankle / Brachial Index (ABI) - do not check if billed separately 0 X - Vital Signs 1 5 Has the patient been seen at the hospital within the last three years: Yes Total Score: 60 Level Of Care: New/Established - Level 2 Electronic Signature(s) Signed: 01/25/2015 3:08:05 PM By: Regan Lemming BSN, RN Entered By: Regan Lemming on 01/25/2015 15:08:04 Penny Clements ([id number]) -------------------------------------------------------------------------------- Encounter Discharge Information Details Patient Name: Penny Clements Date of Service: 01/25/2015 3:00 PM Medical Record Number: [id number] Patient Account Number: [id number] Date of Birth/Sex: 1927/07/03 (81 y.o. Female) Treating RN: Primary Care Physician: Deborra Medina Other Clinician: Referring Physician: Deborra Medina Treating Physician/Extender: BURNS III, Charlean Sanfilippo in Treatment: 13 Encounter Discharge Information Items Schedule Follow-up Appointment: No Medication  Reconciliation completed No and provided to Patient/Care Analleli Gierke: Provided on Clinical Summary of Care: 01/25/2015 Form Type Recipient Paper Patient [place] Signature(s) Signed: 01/25/2015 3:10:06 PM By: Ruthine Dose Entered By: Ruthine Dose on 01/25/2015 15:10:06 Penny Clements ([id number]) -------------------------------------------------------------------------------- General Visit Notes Details Patient Name: Penny Clements Date of Service: 01/25/2015 3:00 PM Medical Record Number: [id number] Patient Account Number: [id number] Date of Birth/Sex: 02/02/1928 (81 y.o. Female) Treating RN: Baruch Gouty, RN, BSN, Velva Harman Primary Care Physician: Deborra Medina Other Clinician: Referring Physician: Deborra Medina Treating Physician/Extender: BURNS III, Charlean Sanfilippo in Treatment: 13 Notes No wound presented on this visit. Electronic Signature(s) Signed: 01/25/2015 4:33:24 PM By: Regan Lemming BSN, RN Entered By: Regan Lemming on 01/25/2015 14:57:36 Penny Clements ([id number]) -------------------------------------------------------------------------------- Lower Extremity Assessment Details Patient Name: Penny Clements Date of Service: 01/25/2015 3:00 PM Medical Record Number: [id number] Patient Account Number: [id number] Date of Birth/Sex: December 14, 1927 (81 y.o. Female) Treating RN: Afful, RN, BSN, Velva Harman Primary Care Physician: Deborra Medina Other Clinician: Referring Physician: Deborra Medina Treating Physician/Extender: BURNS III, Charlean Sanfilippo in Treatment: 13 Edema Assessment Assessed: Shirlyn Goltz: No] [Right: No] E[Left: dema] [Right: :] Calf Left: Right: Point of Measurement: 33 cm From Medial Instep 32.8 cm cm Ankle Left: Right: Point of Measurement: 10 cm From Medial Instep 20.2 cm cm Vascular Assessment Claudication: Claudication Assessment [Left:None] Pulses: Posterior Tibial Dorsalis Pedis Palpable: [Left:Yes] Extremity colors, hair growth, and conditions: Extremity  Color: [Left:Normal] Temperature of Extremity: [Left:Warm] Capillary Refill: [Left:< 3 seconds] Toe Nail Assessment Left: Right: Thick: Yes Discolored: Yes Deformed: No Improper Length and Hygiene: Yes Electronic Signature(s) Signed: 01/25/2015 2:55:21 PM By: Regan Lemming BSN, RN Entered By: Regan Lemming on 01/25/2015 14:55:21 Penny Clements ([name] ([id number]) -------------------------------------------------------------------------------- Multi Wound Chart Details Patient Name: Penny Clements Date of Service: 01/25/2015 3:00 PM Medical Record Number: [id number] Patient Account Number:  [id number] Date of Birth/Sex: [birth year] (81 y.o. Female) Treating RN: Baruch Gouty, RN, BSN, Velva Harman Primary Care Physician: Deborra Medina Other Clinician: Referring Physician: Deborra Medina Treating Physician/Extender: BURNS III, Charlean Sanfilippo in Treatment: 13 Vital Signs Height(in): 63 Pulse(bpm): 98 Weight(lbs): 92 Blood Pressure (mmHg): Body Mass Index(BMI): 16 Temperature(F): Respiratory Rate 20 (breaths/min): Wound Assessments Treatment Notes Electronic Signature(s) Signed: 01/25/2015 4:33:24 PM By: Regan Lemming BSN, RN Entered By: Regan Lemming on 01/25/2015 14:57:55 Penny Clements ([id number]) -------------------------------------------------------------------------------- [place] Details Patient Name: Penny Clements Date of Service: 01/25/2015 3:00 PM Medical Record Number: [id number] Patient Account Number: [id number] Date of Birth/Sex: 04-02-1928 (81 y.o. Female) Treating RN: Afful, RN, BSN, Velva Harman Primary Care Physician: Deborra Medina Other Clinician: Referring Physician: Deborra Medina Treating Physician/Extender: BURNS III, Charlean Sanfilippo in Treatment: 13 Active Inactive Abuse / Safety / Falls / Self Care Management Nursing Diagnoses: Potential for falls Goals: Patient will remain injury free Date Initiated: 04/07/2014 Goal  Status: Active Interventions: Assess fall risk on admission and as needed Notes: Nutrition Nursing Diagnoses: Impaired glucose control: actual or potential Goals: Patient/caregiver agrees to and verbalizes understanding of need to use nutritional supplements and/or vitamins as prescribed Date Initiated: 04/07/2014 Goal Status: Active Interventions: Assess patient nutrition upon admission and as needed per policy Provide education on nutrition Notes: Electronic Signature(s) Signed: 01/25/2015 4:33:24 PM By: Regan Lemming BSN, RN Entered By: Regan Lemming on 01/25/2015 14:57:49 Penny Clements ([id number]) -------------------------------------------------------------------------------- Pain Assessment Details Patient Name: Penny Clements Date of Service: 01/25/2015 3:00 PM Medical Record Number: [id number] Patient Account Number: [id number] Date of Birth/Sex: 05-17-1928 (81 y.o. Female) Treating RN: Baruch Gouty, RN, BSN, Velva Harman Primary Care Physician: Deborra Medina Other Clinician: Referring Physician: Deborra Medina Treating Physician/Extender: BURNS III, Charlean Sanfilippo in Treatment: 13 Active Problems Location of Pain Severity and Description of Pain Patient Has Paino No Site Locations Pain Management and Medication Current Pain Management: Electronic Signature(s) Signed: 01/25/2015 2:54:18 PM By: Regan Lemming BSN, RN Entered By: Regan Lemming on 01/25/2015 14:54:18 Penny Clements ([id number]) -------------------------------------------------------------------------------- Patient/Caregiver Education Details Patient Name: Penny Clements Date of Service: 01/25/2015 3:00 PM Medical Record Number: [id number] Patient Account Number: [id number] Date of Birth/Gender: Nov 02, 1927 (81 y.o. Female) Treating RN: Baruch Gouty, RN, BSN, Velva Harman Primary Care Physician: Deborra Medina Other Clinician: Referring Physician: Deborra Medina Treating Physician/Extender: BURNS III, Charlean Sanfilippo in Treatment:  13 Education Assessment Education Provided To: Patient Education Topics Provided Nutrition: Methods: Explain/Verbal Responses: State content correctly Electronic Signature(s) Signed: 01/25/2015 4:33:24 PM By: Regan Lemming BSN, RN Entered By: Regan Lemming on 01/25/2015 15:11:15 Penny Clements ([id number]) -------------------------------------------------------------------------------- Vitals Details Patient Name: Penny Clements Date of Service: 01/25/2015 3:00 PM Medical Record Number: [id number] Patient Account Number: [id number] Date of Birth/Sex: 08-23-1927 (81 y.o. Female) Treating RN: Afful, RN, BSN, [place] Primary Care Physician: Deborra Medina Other Clinician: Referring Physician: Deborra Medina Treating Physician/Extender: BURNS III, Charlean Sanfilippo in Treatment: 13 Vital Signs Time Taken: 14:54 Temperature (F): 97.8 Height (in): 63 Pulse (bpm): 98 Weight (lbs): 92 Respiratory Rate (breaths/min): 20 Body Mass Index (BMI): 16.3 Blood Pressure (mmHg): 143/63 Reference Range: 80 - 120 mg / dl Electronic Signature(s) Signed: 01/25/2015 3:01:08 PM By: Regan Lemming BSN, RN Previous Signature: 01/25/2015 2:54:33 PM Version By: Regan Lemming BSN, RN Entered By: Regan Lemming on 01/25/2015 15:01:07

## 2015-01-26 NOTE — Progress Notes (Signed)
Penny Clements, Penny Clements ([id number]) Visit Report for 01/25/2015 Chief Complaint Document Details Patient Name: Penny, Clements Date of Service: 01/25/2015 3:00 PM Medical Record Number: [id number] Patient Account Number: [id number] Date of Birth/Sex: [birth year] (81 y.o. Female) Treating RN: Primary Care Physician: Deborra Medina Other Clinician: Referring Physician: Deborra Medina Treating Physician/Extender: BURNS III, Charlean Sanfilippo in Treatment: 13 Information Obtained from: Patient Chief Complaint Chronic left lateral foot ulcers (healed). Stasis dermatitis. New-onset left calf swelling. Electronic Signature(s) Signed: 01/25/2015 3:57:56 PM By: Loletha Grayer MD Entered By: Loletha Grayer on 01/25/2015 15:16:10 Penny Clements ([id number]) -------------------------------------------------------------------------------- HPI Details Patient Name: Penny Clements Date of Service: 01/25/2015 3:00 PM Medical Record Number: [id number] Patient Account Number: [id number] Date of Birth/Sex: 1927-10-11 (81 y.o. Female) Treating RN: Primary Care Physician: Deborra Medina Other Clinician: Referring Physician: Deborra Medina Treating Physician/Extender: BURNS III, Charlean Sanfilippo in Treatment: 13 History of Present Illness HPI Description: Very pleasant 81 year old with a past medical history significant for type 2 diabetes and lung cancer (status post chemoradiation). She has had a recurrent left diabetic foot ulcer for almost 10 years, which has healed in the past with compression therapy. This recently recurred in October 2015. No history of trauma. No significant pain. No symptoms to indicate ischemic rest pain or claudication. ABI 1.11. Ambulating per her baseline with a walker. Culture on 05/18/2014 grew methicillin sensitive staph aureus and staph lugdunensis, both sensitive to tetracycline. She was started on doxycycline but developed severe itching. She does not want  anymore antibiotics. Punch biopsy on 06/29/2014 showed no evidence for malignancy. Consistent with stasis dermatitis. Underwent normal CVI ultrasound at [facility] in March 2016. Original ulcer healed. She developed a second adjacent ulceration dorsally. Treated with regular debridements, silver/collagen and 4 layer compression. Improved with epifix x8 (initially applied on 07/06/2014). Remaining ulcer healed at her last clinic visit. Applying Gold bond cream. Not wearing compression stockings. She returns to clinic for follow-up and complains of new onset left calf swelling. No significant pain or tenderness. No trauma. Ambulating per her baseline. No fever or chills. No drainage. Electronic Signature(s) Signed: 01/25/2015 3:57:56 PM By: Loletha Grayer MD Entered By: Loletha Grayer on 01/25/2015 15:19:00 Penny Clements ([id number]) -------------------------------------------------------------------------------- Physical Exam Details Patient Name: Penny Clements Date of Service: 01/25/2015 3:00 PM Medical Record Number: [id number] Patient Account Number: [id number] Date of Birth/Sex: 10-29-1927 (81 y.o. Female) Treating RN: Primary Care Physician: Deborra Medina Other Clinician: Referring Physician: Deborra Medina Treating Physician/Extender: BURNS III, Somnang Mahan Weeks in Treatment: 13 Constitutional . Pulse regular. Respirations normal and unlabored. Afebrile. Marland Kitchen Respiratory WNL. No retractions.. Cardiovascular Pedal Pulses WNL. Integumentary (Hair, Skin) .Marland Kitchen Neurological . Psychiatric Judgement and insight Intact.. Oriented times 3.. No evidence of depression, anxiety, or agitation.. Notes Left foot ulcerations remain healed. No cellulitis. Stasis dermatitis improved. Minimal edema. Mild left calf swelling and fullness. Nontender. No pain with dorsiflexion/extension. Palpable DP. Electronic Signature(s) Signed: 01/25/2015 3:57:56 PM By: Loletha Grayer  MD Entered By: Loletha Grayer on 01/25/2015 15:20:18 Penny Clements ([id number]) -------------------------------------------------------------------------------- Physician Orders Details Patient Name: Penny Clements Date of Service: 01/25/2015 3:00 PM Medical Record Number: [id number] Patient Account Number: [id number] Date of Birth/Sex: [birth year] (81 y.o. Female) Treating RN: Baruch Gouty, RN, BSN, Velva Harman Primary Care Physician: Deborra Medina Other Clinician: Referring Physician: Deborra Medina Treating Physician/Extender: BURNS III, Charlean Sanfilippo in Treatment: 13 Verbal / Phone Orders: Yes Clinician: Afful, RN, BSN, Rita Read Back and Verified: Yes Diagnosis Coding Skin Barriers/Peri-Wound Care   o Other: - COntinue using triamcinalon cream Follow-up Appointments o Return Appointment in 1 week. - to discuss results of ultrasound Electronic Signature(s) Signed: 01/25/2015 3:06:15 PM By: Regan Lemming BSN, RN Signed: 01/25/2015 3:57:56 PM By: Loletha Grayer MD Entered By: Regan Lemming on 01/25/2015 15:06:15 Penny Clements ([id number]) -------------------------------------------------------------------------------- Problem List Details Patient Name: Penny Clements Date of Service: 01/25/2015 3:00 PM Medical Record Number: [id number] Patient Account Number: [id number] Date of Birth/Sex: [birth year] (81 y.o. 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 01/11/2015 Yes R22.42 Localized swelling, mass and lump, left lower limb 01/25/2015 Yes Inactive Problems Resolved Problems ICD-10 Code Description Active Date Resolved  Date E11.621 Type 2 diabetes mellitus with foot ulcer 04/07/2014 N82.956 Varicose veins of left lower extremity with both ulcer of 07/06/2014 ankle and inflammation Electronic Signature(s) Signed: 01/25/2015 3:57:56 PM By: Loletha Grayer MD Entered By: Loletha Grayer on 01/25/2015 15:15:50 Penny Clements ([id number]) -------------------------------------------------------------------------------- Progress Note Details Patient Name: Penny Clements Date of Service: 01/25/2015 3:00 PM Medical Record Number: [id number] Patient Account Number: [id number] Date of Birth/Sex: 12-14-1927 (81 y.o. Female) Treating RN: Primary Care Physician: Deborra Medina Other Clinician: Referring Physician: Deborra Medina Treating Physician/Extender: BURNS III, Charlean Sanfilippo in Treatment: 13 Subjective Chief Complaint Information obtained from Patient Chronic left lateral foot ulcers (healed). Stasis dermatitis. New-onset left calf swelling. History of Present Illness (HPI) Very pleasant 81 year old with a past medical history significant for type 2 diabetes and lung cancer (status post chemoradiation). She has had a recurrent left diabetic foot ulcer for almost 10 years, which has healed in the past with compression therapy. This recently recurred in October 2015. No history of trauma. No significant pain. No symptoms to indicate ischemic rest pain or claudication. ABI 1.11. Ambulating per her baseline with a walker. Culture on 05/18/2014 grew methicillin sensitive staph aureus and staph lugdunensis, both sensitive to tetracycline. She was started on doxycycline but developed severe itching. She does not want anymore antibiotics. Punch biopsy on 06/29/2014 showed no evidence for malignancy. Consistent with stasis dermatitis. Underwent normal CVI ultrasound at [facility] in March 2016. Original ulcer healed. She developed a second adjacent ulceration dorsally. Treated with regular  debridements, silver/collagen and 4 layer compression. Improved with epifix x8 (initially applied on 07/06/2014). Remaining ulcer healed at her last clinic visit. Applying Gold bond cream. Not wearing compression stockings. She returns to clinic for follow-up and complains of new onset left calf swelling. No significant pain or tenderness. No trauma. Ambulating per her baseline. No fever or chills. No drainage. Objective Constitutional Pulse regular. Respirations normal and unlabored. Afebrile. Vitals Time Taken: 2:54 PM, Height: 63 in, Weight: 92 lbs, BMI: 16.3, Temperature: 97.8 F, Pulse: 98 bpm, Respiratory Rate: 20 breaths/min, Blood Pressure: 143/63 mmHg. Penny Clements, Penny TMarland Kitchen ([id number]) Respiratory WNL. No retractions.. Cardiovascular Pedal Pulses WNL. Psychiatric Judgement and insight Intact.. Oriented times 3.. No evidence of depression, anxiety, or agitation.. General Notes: Left foot ulcerations remain healed. No cellulitis. Stasis dermatitis improved. Minimal edema. Mild left calf swelling and fullness. Nontender. No pain with dorsiflexion/extension. Palpable DP. Integumentary (Hair, Skin) Assessment Active Problems ICD-10 E11.40 - Type 2 diabetes mellitus with diabetic neuropathy, unspecified E11.620 - Type 2 diabetes mellitus with diabetic dermatitis R22.42 -  Localized swelling, mass and lump, left lower limb Healed, chronic left foot ulcerations. Stasis dermatitis. New-onset left calf swelling. Plan Skin Barriers/Peri-Wound Care: Other: - COntinue using triamcinalon cream Follow-up Appointments: Return Appointment in 1 week. - to discuss results of ultrasound Penny Clements, Penny Clements. ([id number]) Left lower extremity ultrasound to rule out DVT. Continue with Gold Bond cream. Recommended 15-20 mmHg compression stockings. Frequent leg elevation and ambulation. Return to clinic after ultrasound per patient request. Electronic Signature(s) Signed: 01/25/2015 3:57:56 PM By:  Loletha Grayer MD Entered By: Loletha Grayer on 01/25/2015 15:21:29 Penny Clements ([id number]) -------------------------------------------------------------------------------- SuperBill Details Patient Name: Penny Clements Date of Service: 01/25/2015 Medical Record Number: [id number] Patient Account Number: [id number] Date of Birth/Sex: 17-Apr-1928 (81 y.o. 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

## 2015-02-01 ENCOUNTER — Ambulatory Visit: Payer: Self-pay | Admitting: Surgery

## 2015-02-02 ENCOUNTER — Other Ambulatory Visit: Payer: Self-pay | Admitting: Internal Medicine

## 2015-02-09 ENCOUNTER — Emergency Department: Payer: Medicare Other

## 2015-02-09 ENCOUNTER — Encounter: Payer: Self-pay | Admitting: *Deleted

## 2015-02-09 ENCOUNTER — Inpatient Hospital Stay
Admission: EM | Admit: 2015-02-09 | Discharge: 2015-02-13 | DRG: 481 | Disposition: A | Discharge status: Skilled Nursing Facility | Payer: Medicare Other | Attending: Internal Medicine | Admitting: Internal Medicine

## 2015-02-09 DIAGNOSIS — Z01811 Encounter for preprocedural respiratory examination: Secondary | ICD-10-CM

## 2015-02-09 DIAGNOSIS — D62 Acute posthemorrhagic anemia: Secondary | ICD-10-CM | POA: Diagnosis not present

## 2015-02-09 DIAGNOSIS — E119 Type 2 diabetes mellitus without complications: Secondary | ICD-10-CM | POA: Diagnosis present

## 2015-02-09 DIAGNOSIS — Z882 Allergy status to sulfonamides status: Secondary | ICD-10-CM

## 2015-02-09 DIAGNOSIS — F329 Major depressive disorder, single episode, unspecified: Secondary | ICD-10-CM | POA: Diagnosis present

## 2015-02-09 DIAGNOSIS — Z888 Allergy status to other drugs, medicaments and biological substances status: Secondary | ICD-10-CM

## 2015-02-09 DIAGNOSIS — Z7982 Long term (current) use of aspirin: Secondary | ICD-10-CM | POA: Diagnosis not present

## 2015-02-09 DIAGNOSIS — Z66 Do not resuscitate: Secondary | ICD-10-CM | POA: Diagnosis present

## 2015-02-09 DIAGNOSIS — Z85118 Personal history of other malignant neoplasm of bronchus and lung: Secondary | ICD-10-CM

## 2015-02-09 DIAGNOSIS — Z87891 Personal history of nicotine dependence: Secondary | ICD-10-CM | POA: Diagnosis not present

## 2015-02-09 DIAGNOSIS — D649 Anemia, unspecified: Secondary | ICD-10-CM | POA: Diagnosis present

## 2015-02-09 DIAGNOSIS — E440 Moderate protein-calorie malnutrition: Secondary | ICD-10-CM | POA: Insufficient documentation

## 2015-02-09 DIAGNOSIS — S72009A Fracture of unspecified part of neck of unspecified femur, initial encounter for closed fracture: Secondary | ICD-10-CM | POA: Diagnosis present

## 2015-02-09 DIAGNOSIS — Z923 Personal history of irradiation: Secondary | ICD-10-CM | POA: Diagnosis not present

## 2015-02-09 DIAGNOSIS — T148XXA Other injury of unspecified body region, initial encounter: Secondary | ICD-10-CM

## 2015-02-09 DIAGNOSIS — S72141A Displaced intertrochanteric fracture of right femur, initial encounter for closed fracture: Secondary | ICD-10-CM | POA: Diagnosis present

## 2015-02-09 DIAGNOSIS — Z881 Allergy status to other antibiotic agents status: Secondary | ICD-10-CM | POA: Diagnosis not present

## 2015-02-09 DIAGNOSIS — Z9889 Other specified postprocedural states: Secondary | ICD-10-CM

## 2015-02-09 DIAGNOSIS — Z79899 Other long term (current) drug therapy: Secondary | ICD-10-CM | POA: Diagnosis not present

## 2015-02-09 DIAGNOSIS — R32 Unspecified urinary incontinence: Secondary | ICD-10-CM | POA: Diagnosis present

## 2015-02-09 DIAGNOSIS — F419 Anxiety disorder, unspecified: Secondary | ICD-10-CM | POA: Diagnosis present

## 2015-02-09 DIAGNOSIS — Z91041 Radiographic dye allergy status: Secondary | ICD-10-CM | POA: Diagnosis not present

## 2015-02-09 DIAGNOSIS — W19XXXA Unspecified fall, initial encounter: Secondary | ICD-10-CM | POA: Diagnosis present

## 2015-02-09 DIAGNOSIS — Z794 Long term (current) use of insulin: Secondary | ICD-10-CM | POA: Diagnosis not present

## 2015-02-09 DIAGNOSIS — Z8781 Personal history of (healed) traumatic fracture: Secondary | ICD-10-CM

## 2015-02-09 DIAGNOSIS — Z9221 Personal history of antineoplastic chemotherapy: Secondary | ICD-10-CM | POA: Diagnosis not present

## 2015-02-09 LAB — PROTIME-INR
INR: 1.02
PROTHROMBIN TIME: 13.6 s (ref 11.4–15.0)

## 2015-02-09 LAB — BASIC METABOLIC PANEL
ANION GAP: 5 (ref 5–15)
BUN: 32 mg/dL — ABNORMAL HIGH (ref 6–20)
CHLORIDE: 103 mmol/L (ref 101–111)
CO2: 30 mmol/L (ref 22–32)
CREATININE: 0.99 mg/dL (ref 0.44–1.00)
Calcium: 9.3 mg/dL (ref 8.9–10.3)
GFR calc non Af Amer: 51 mL/min — ABNORMAL LOW (ref >60)
GFR, EST AFRICAN AMERICAN: 59 mL/min — AB (ref >60)
Glucose, Bld: 238 mg/dL — ABNORMAL HIGH (ref 65–99)
POTASSIUM: 4.3 mmol/L (ref 3.5–5.1)
SODIUM: 138 mmol/L (ref 135–145)

## 2015-02-09 LAB — CBC WITH DIFFERENTIAL/PLATELET
Basophils Absolute: 0.1 10*3/uL (ref 0–0.1)
Basophils Relative: 1 %
EOS ABS: 0.4 10*3/uL (ref 0–0.7)
Eosinophils Relative: 6 %
HCT: 35.1 % (ref 35.0–47.0)
HEMOGLOBIN: 11 g/dL — AB (ref 12.0–16.0)
LYMPHS ABS: 0.9 10*3/uL — AB (ref 1.0–3.6)
Lymphocytes Relative: 12 %
MCH: 26.4 pg (ref 26.0–34.0)
MCHC: 31.2 g/dL — ABNORMAL LOW (ref 32.0–36.0)
MCV: 84.8 fL (ref 80.0–100.0)
Monocytes Absolute: 0.6 10*3/uL (ref 0.2–0.9)
Monocytes Relative: 8 %
NEUTROS ABS: 5.4 10*3/uL (ref 1.4–6.5)
NEUTROS PCT: 73 %
Platelets: 297 10*3/uL (ref 150–440)
RBC: 4.15 MIL/uL (ref 3.80–5.20)
RDW: 18.5 % — ABNORMAL HIGH (ref 11.5–14.5)
WBC: 7.3 10*3/uL (ref 3.6–11.0)

## 2015-02-09 LAB — SURGICAL PCR SCREEN
MRSA, PCR: NEGATIVE
STAPHYLOCOCCUS AUREUS: POSITIVE — AB

## 2015-02-09 LAB — URINALYSIS COMPLETE WITH MICROSCOPIC (ARMC ONLY)
Bilirubin Urine: NEGATIVE
GLUCOSE, UA: NEGATIVE mg/dL
HGB URINE DIPSTICK: NEGATIVE
KETONES UR: NEGATIVE mg/dL
LEUKOCYTES UA: NEGATIVE
NITRITE: NEGATIVE
Protein, ur: NEGATIVE mg/dL
SPECIFIC GRAVITY, URINE: 1.019 (ref 1.005–1.030)
SQUAMOUS EPITHELIAL / LPF: NONE SEEN
pH: 5 (ref 5.0–8.0)

## 2015-02-09 LAB — GLUCOSE, CAPILLARY
GLUCOSE-CAPILLARY: 129 mg/dL — AB (ref 65–99)
GLUCOSE-CAPILLARY: 335 mg/dL — AB (ref 65–99)

## 2015-02-09 LAB — ALBUMIN: ALBUMIN: 3.3 g/dL — AB (ref 3.5–5.0)

## 2015-02-09 LAB — APTT: APTT: 23 s — AB (ref 24–36)

## 2015-02-09 LAB — ABO/RH: ABO/RH(D): O POS

## 2015-02-09 MED ORDER — DIPHENHYDRAMINE HCL 25 MG PO TABS
25.0000 mg | ORAL_TABLET | Freq: Four times a day (QID) | ORAL | Status: DC | PRN
  Filled 2015-02-09: qty 1

## 2015-02-09 MED ORDER — OXYCODONE HCL 5 MG PO TABS: 5.0000 mg | ORAL_TABLET | ORAL | Status: DC | PRN

## 2015-02-09 MED ORDER — MUPIROCIN 2 % EX OINT
1.0000 "application " | TOPICAL_OINTMENT | Freq: Two times a day (BID) | CUTANEOUS | Status: DC
  Administered 2015-02-10 (×2): 1 via NASAL
  Filled 2015-02-09 (×2): qty 22

## 2015-02-09 MED ORDER — VITAMIN D 1000 UNITS PO TABS
2000.0000 [IU] | ORAL_TABLET | Freq: Every day | ORAL | Status: DC
  Administered 2015-02-11 – 2015-02-13 (×3): 2000 [IU] via ORAL
  Filled 2015-02-09 (×3): qty 2

## 2015-02-09 MED ORDER — SODIUM CHLORIDE 0.9 % IV SOLN
INTRAVENOUS | Status: DC
  Administered 2015-02-09: 20:00:00 via INTRAVENOUS

## 2015-02-09 MED ORDER — ACETAMINOPHEN 325 MG PO TABS
650.0000 mg | ORAL_TABLET | Freq: Four times a day (QID) | ORAL | Status: DC | PRN
  Administered 2015-02-10: 650 mg via ORAL
  Filled 2015-02-09: qty 2

## 2015-02-09 MED ORDER — INSULIN ASPART 100 UNIT/ML ~~LOC~~ SOLN
0.0000 [IU] | Freq: Three times a day (TID) | SUBCUTANEOUS | Status: DC
  Administered 2015-02-10 (×2): 5 [IU] via SUBCUTANEOUS
  Filled 2015-02-09 (×2): qty 5

## 2015-02-09 MED ORDER — OMEGA-3-ACID ETHYL ESTERS 1 G PO CAPS
1.0000 | ORAL_CAPSULE | Freq: Every day | ORAL | Status: DC
  Administered 2015-02-11 – 2015-02-13 (×3): 1 g via ORAL
  Filled 2015-02-09 (×3): qty 1

## 2015-02-09 MED ORDER — SODIUM CHLORIDE 0.9 % IV SOLN: INTRAVENOUS | Status: DC

## 2015-02-09 MED ORDER — ACETAMINOPHEN 650 MG RE SUPP: 650.0000 mg | Freq: Four times a day (QID) | RECTAL | Status: DC | PRN

## 2015-02-09 MED ORDER — RISAQUAD PO CAPS
1.0000 | ORAL_CAPSULE | Freq: Every day | ORAL | Status: DC
  Administered 2015-02-11 – 2015-02-13 (×3): 1 via ORAL
  Filled 2015-02-09 (×3): qty 1

## 2015-02-09 MED ORDER — PAROXETINE HCL 10 MG PO TABS
10.0000 mg | ORAL_TABLET | Freq: Every day | ORAL | Status: DC
  Filled 2015-02-09 (×2): qty 1

## 2015-02-09 MED ORDER — HYDROCODONE-ACETAMINOPHEN 5-325 MG PO TABS
1.0000 | ORAL_TABLET | ORAL | Status: DC | PRN
Start: 2015-02-09 — End: 2015-02-10

## 2015-02-09 MED ORDER — CHLORHEXIDINE GLUCONATE CLOTH 2 % EX PADS
6.0000 | MEDICATED_PAD | Freq: Every day | CUTANEOUS | Status: DC
  Administered 2015-02-10: 6 via TOPICAL

## 2015-02-09 MED ORDER — MORPHINE SULFATE (PF) 2 MG/ML IV SOLN: 1.0000 mg | INTRAVENOUS | Status: DC | PRN

## 2015-02-09 MED ORDER — MORPHINE SULFATE (PF) 2 MG/ML IV SOLN: 2.0000 mg | INTRAVENOUS | Status: DC | PRN
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
## 2015-02-09 NOTE — ED Notes (Signed)
Per EMS report, patient is a resident at [facility] and fell in the lobby. Patient states she feels like she twisted her right ankle and fell on her right side. No LOC reported per patient. Patient reports pain in right knee and right hip, 2/10.

## 2015-02-10 ENCOUNTER — Inpatient Hospital Stay: Payer: Medicare Other | Admitting: *Deleted

## 2015-02-10 ENCOUNTER — Encounter
Admission: EM | Disposition: A | Discharge status: Skilled Nursing Facility | Payer: Self-pay | Source: Home / Self Care | Attending: Internal Medicine

## 2015-02-10 ENCOUNTER — Encounter: Payer: Self-pay | Admitting: Orthopedic Surgery

## 2015-02-10 ENCOUNTER — Inpatient Hospital Stay: Payer: Medicare Other

## 2015-02-10 DIAGNOSIS — E440 Moderate protein-calorie malnutrition: Secondary | ICD-10-CM | POA: Insufficient documentation

## 2015-02-10 HISTORY — PX: FEMUR IM NAIL: SHX1597

## 2015-02-10 LAB — CBC
HEMATOCRIT: 27.9 % — AB (ref 35.0–47.0)
HEMOGLOBIN: 8.9 g/dL — AB (ref 12.0–16.0)
MCH: 26.9 pg (ref 26.0–34.0)
MCHC: 31.9 g/dL — AB (ref 32.0–36.0)
MCV: 84.4 fL (ref 80.0–100.0)
Platelets: 212 10*3/uL (ref 150–440)
RBC: 3.3 MIL/uL — AB (ref 3.80–5.20)
RDW: 17.9 % — ABNORMAL HIGH (ref 11.5–14.5)
WBC: 6.9 10*3/uL (ref 3.6–11.0)

## 2015-02-10 LAB — VITAMIN D 25 HYDROXY (VIT D DEFICIENCY, FRACTURES): VIT D 25 HYDROXY: 32.2 ng/mL (ref 30.0–100.0)

## 2015-02-10 LAB — BASIC METABOLIC PANEL
Anion gap: 6 (ref 5–15)
BUN: 26 mg/dL — AB (ref 6–20)
CHLORIDE: 105 mmol/L (ref 101–111)
CO2: 27 mmol/L (ref 22–32)
CREATININE: 0.83 mg/dL (ref 0.44–1.00)
Calcium: 8.4 mg/dL — ABNORMAL LOW (ref 8.9–10.3)
GFR calc Af Amer: >60 mL/min (ref >60)
GFR calc non Af Amer: >60 mL/min (ref >60)
GLUCOSE: 276 mg/dL — AB (ref 65–99)
POTASSIUM: 4 mmol/L (ref 3.5–5.1)
SODIUM: 138 mmol/L (ref 135–145)

## 2015-02-10 LAB — GLUCOSE, CAPILLARY
GLUCOSE-CAPILLARY: 247 mg/dL — AB (ref 65–99)
Glucose-Capillary: 259 mg/dL — ABNORMAL HIGH (ref 65–99)
Glucose-Capillary: 288 mg/dL — ABNORMAL HIGH (ref 65–99)
Glucose-Capillary: 308 mg/dL — ABNORMAL HIGH (ref 65–99)

## 2015-02-10 LAB — PREPARE RBC (CROSSMATCH)

## 2015-02-10 SURGERY — INSERTION, INTRAMEDULLARY ROD, FEMUR
Anesthesia: Monitor Anesthesia Care | Laterality: Right

## 2015-02-10 MED ORDER — PROPOFOL 10 MG/ML IV BOLUS
INTRAVENOUS | Status: DC | PRN
  Administered 2015-02-10: 20 ug via INTRAVENOUS
  Administered 2015-02-10 (×2): 10 ug via INTRAVENOUS

## 2015-02-10 MED ORDER — ONDANSETRON HCL 4 MG/2ML IJ SOLN: 4.0000 mg | Freq: Once | INTRAMUSCULAR | Status: DC | PRN

## 2015-02-10 MED ORDER — INSULIN ASPART 100 UNIT/ML ~~LOC~~ SOLN
0.0000 [IU] | Freq: Three times a day (TID) | SUBCUTANEOUS | Status: DC
  Administered 2015-02-10 – 2015-02-11 (×2): 7 [IU] via SUBCUTANEOUS
  Administered 2015-02-11: 3 [IU] via SUBCUTANEOUS
  Administered 2015-02-11: 7 [IU] via SUBCUTANEOUS
  Filled 2015-02-10: qty 7
  Filled 2015-02-10: qty 3
  Filled 2015-02-10: qty 7

## 2015-02-10 MED ORDER — MORPHINE SULFATE (PF) 2 MG/ML IV SOLN: 2.0000 mg | INTRAVENOUS | Status: DC | PRN

## 2015-02-10 MED ORDER — MAGNESIUM HYDROXIDE 400 MG/5ML PO SUSP
30.0000 mL | Freq: Every day | ORAL | Status: DC | PRN
  Administered 2015-02-12: 30 mL via ORAL
  Filled 2015-02-10: qty 30

## 2015-02-10 MED ORDER — KETAMINE HCL 50 MG/ML IJ SOLN
INTRAMUSCULAR | Status: DC | PRN
  Administered 2015-02-10: 25 mg via INTRAVENOUS

## 2015-02-10 MED ORDER — VANCOMYCIN HCL IN DEXTROSE 750-5 MG/150ML-% IV SOLN
750.0000 mg | Freq: Once | INTRAVENOUS | Status: AC
  Administered 2015-02-11: 750 mg via INTRAVENOUS
  Filled 2015-02-10: qty 150

## 2015-02-10 MED ORDER — MAGNESIUM CITRATE PO SOLN: 1.0000 | Freq: Once | ORAL | Status: DC | PRN

## 2015-02-10 MED ORDER — ENOXAPARIN SODIUM 30 MG/0.3ML ~~LOC~~ SOLN
30.0000 mg | Freq: Two times a day (BID) | SUBCUTANEOUS | Status: DC
  Administered 2015-02-11 – 2015-02-13 (×5): 30 mg via SUBCUTANEOUS
  Filled 2015-02-10 (×5): qty 0.3

## 2015-02-10 MED ORDER — FERROUS SULFATE 325 (65 FE) MG PO TABS
325.0000 mg | ORAL_TABLET | Freq: Three times a day (TID) | ORAL | Status: DC
  Filled 2015-02-10 (×2): qty 1

## 2015-02-10 MED ORDER — HYDROCODONE-ACETAMINOPHEN 5-325 MG PO TABS
1.0000 | ORAL_TABLET | Freq: Four times a day (QID) | ORAL | Status: DC | PRN
  Administered 2015-02-11 – 2015-02-12 (×3): 1 via ORAL
  Filled 2015-02-10 (×4): qty 1

## 2015-02-10 MED ORDER — BUPIVACAINE HCL (PF) 0.5 % IJ SOLN
INTRAMUSCULAR | Status: DC | PRN
  Administered 2015-02-10: 3 mL via INTRATHECAL

## 2015-02-10 MED ORDER — PROPOFOL INFUSION 10 MG/ML OPTIME
INTRAVENOUS | Status: DC | PRN
  Administered 2015-02-10: 25 ug/kg/min via INTRAVENOUS

## 2015-02-10 MED ORDER — FENTANYL CITRATE (PF) 100 MCG/2ML IJ SOLN: 25.0000 ug | INTRAMUSCULAR | Status: DC | PRN

## 2015-02-10 MED ORDER — LACTATED RINGERS IV SOLN
INTRAVENOUS | Status: DC | PRN
  Administered 2015-02-10: 11:00:00 via INTRAVENOUS

## 2015-02-10 MED ORDER — VANCOMYCIN HCL IN DEXTROSE 1-5 GM/200ML-% IV SOLN
1000.0000 mg | Freq: Two times a day (BID) | INTRAVENOUS | Status: DC
  Filled 2015-02-10: qty 200

## 2015-02-10 MED ORDER — VANCOMYCIN HCL IN DEXTROSE 1-5 GM/200ML-% IV SOLN
1000.0000 mg | Freq: Once | INTRAVENOUS | Status: AC
  Administered 2015-02-10: 1000 mg via INTRAVENOUS
  Filled 2015-02-10: qty 200

## 2015-02-10 MED ORDER — NEOMYCIN-POLYMYXIN B GU 40-200000 IR SOLN
Status: AC
Start: 2015-02-10 — End: 2015-02-10
  Filled 2015-02-10: qty 2

## 2015-02-10 MED ORDER — FENTANYL CITRATE (PF) 100 MCG/2ML IJ SOLN
INTRAMUSCULAR | Status: DC | PRN
  Administered 2015-02-10: 50 ug via INTRAVENOUS

## 2015-02-10 MED ORDER — DOCUSATE SODIUM 100 MG PO CAPS
100.0000 mg | ORAL_CAPSULE | Freq: Two times a day (BID) | ORAL | Status: DC
  Administered 2015-02-10 – 2015-02-13 (×6): 100 mg via ORAL
  Filled 2015-02-10 (×6): qty 1

## 2015-02-10 MED ORDER — MENTHOL 3 MG MT LOZG: 1.0000 | LOZENGE | OROMUCOSAL | Status: DC | PRN

## 2015-02-10 MED ORDER — BISACODYL 10 MG RE SUPP: 10.0000 mg | Freq: Every day | RECTAL | Status: DC | PRN

## 2015-02-10 MED ORDER — ONDANSETRON HCL 4 MG PO TABS: 4.0000 mg | ORAL_TABLET | Freq: Four times a day (QID) | ORAL | Status: DC | PRN

## 2015-02-10 MED ORDER — PHENOL 1.4 % MT LIQD: 1.0000 | OROMUCOSAL | Status: DC | PRN

## 2015-02-10 MED ORDER — SODIUM CHLORIDE 0.9 % IV SOLN
INTRAVENOUS | Status: DC
  Administered 2015-02-10: 14:00:00 via INTRAVENOUS

## 2015-02-10 MED ORDER — ALUM & MAG HYDROXIDE-SIMETH 200-200-20 MG/5ML PO SUSP: 30.0000 mL | ORAL | Status: DC | PRN

## 2015-02-10 MED ORDER — ACETAMINOPHEN 325 MG PO TABS
650.0000 mg | ORAL_TABLET | Freq: Four times a day (QID) | ORAL | Status: DC | PRN
  Administered 2015-02-10: 650 mg via ORAL
  Filled 2015-02-10: qty 2

## 2015-02-10 MED ORDER — ACETAMINOPHEN 650 MG RE SUPP: 650.0000 mg | Freq: Four times a day (QID) | RECTAL | Status: DC | PRN

## 2015-02-10 MED ORDER — ONDANSETRON HCL 4 MG/2ML IJ SOLN: 4.0000 mg | Freq: Four times a day (QID) | INTRAMUSCULAR | Status: DC | PRN

## 2015-02-10 SURGICAL SUPPLY — 29 items
BIT DRILL CANN LG 4.3MM (BIT) ×1 IMPLANT
BNDG COHESIVE 6X5 TAN STRL LF (GAUZE/BANDAGES/DRESSINGS) ×3 IMPLANT
CANISTER SUCT 1200ML W/VALVE (MISCELLANEOUS) ×3 IMPLANT
DRAPE SURG 17X11 SM STRL (DRAPES) ×3 IMPLANT
DRAPE U-SHAPE 47X51 STRL (DRAPES) ×3 IMPLANT
DRILL BIT CANN LG 4.3MM (BIT) ×3
DRSG OPSITE POSTOP 4X14 (GAUZE/BANDAGES/DRESSINGS) ×3 IMPLANT
DURAPREP 26ML APPLICATOR (WOUND CARE) ×3 IMPLANT
GLOVE BIOGEL PI IND STRL 9 (GLOVE) ×1 IMPLANT
GLOVE BIOGEL PI INDICATOR 9 (GLOVE) ×2
GLOVE SURG 9.0 ORTHO LTXF (GLOVE) ×3 IMPLANT
GOWN STRL REUS TWL 2XL XL LVL4 (GOWN DISPOSABLE) ×3 IMPLANT
GOWN STRL REUS W/ TWL LRG LVL3 (GOWN DISPOSABLE) ×1 IMPLANT
GOWN STRL REUS W/TWL LRG LVL3 (GOWN DISPOSABLE) ×2
GUIDEPIN VERSANAIL DSP 3.2X444 ×3 IMPLANT
HEMOVAC 400CC 10FR (MISCELLANEOUS) ×3 IMPLANT
HFN 125 DEG 11MM X 180MM (Orthopedic Implant) ×3 IMPLANT
KIT RM TURNOVER CYSTO AR (KITS) ×3 IMPLANT
MAT BLUE FLOOR 46X72 FLO (MISCELLANEOUS) ×3 IMPLANT
NS IRRIG 1000ML POUR BTL (IV SOLUTION) ×3 IMPLANT
PACK HIP COMPR (MISCELLANEOUS) ×3 IMPLANT
PAD GROUND ADULT SPLIT (MISCELLANEOUS) ×3 IMPLANT
SCREW BONE CORTICAL 5.0X3 (Screw) ×3 IMPLANT
SCREW LAG HIP NAIL 10.5X95 (Screw) ×3 IMPLANT
STAPLER SKIN PROX 35W (STAPLE) ×3 IMPLANT
SUCTION FRAZIER TIP 10 FR DISP (SUCTIONS) ×3 IMPLANT
SUT VIC AB 2-0 CT1 27 (SUTURE) ×2
SUT VIC AB 2-0 CT1 TAPERPNT 27 (SUTURE) ×1 IMPLANT
SYR 30ML LL (SYRINGE) ×3 IMPLANT

## 2015-02-10 NOTE — Progress Notes (Signed)
Subjective:  Patient This Morning Lying in Her Bed. Patient reports pain as mild.    Objective:   VITALS:   Filed Vitals:   02/09/15 1820 02/09/15 2046 02/10/15 0426 02/10/15 0742  BP: 128/55 130/58 136/67 123/56  Pulse: 76 118 102 98  Temp: 98.3 F (36.8 C) 98.1 F (36.7 C) 98 F (36.7 C) 98.7 F (37.1 C)  TempSrc: Oral Oral Oral Oral  Resp:  16 16 18   Height:      Weight:      SpO2: 100% 98% 100% 98%   Right lower extremity: Remains neurovascular intact. She can flex and extend her toes. She has palpable pedal pulses and intact sensation light touch.   LABS  Results for orders placed or performed during the hospital encounter of 02/09/15 (from the past 24 hour(s))  Surgical pcr screen     Status: Abnormal   Collection Time: 02/09/15  3:10 PM  Result Value Ref Range   MRSA, PCR NEGATIVE NEGATIVE   Staphylococcus aureus POSITIVE (A) NEGATIVE  Basic metabolic panel     Status: Abnormal   Collection Time: 02/09/15  3:27 PM  Result Value Ref Range   Sodium 138 135 - 145 mmol/L   Potassium 4.3 3.5 - 5.1 mmol/L   Chloride 103 101 - 111 mmol/L   CO2 30 22 - 32 mmol/L   Glucose, Bld 238 (H) 65 - 99 mg/dL   BUN 32 (H) 6 - 20 mg/dL   Creatinine, Ser 0.99 0.44 - 1.00 mg/dL   Calcium 9.3 8.9 - 10.3 mg/dL   GFR calc non Af Amer 51 (L) >60 mL/min   GFR calc Af Amer 59 (L) >60 mL/min   Anion gap 5 5 - 15  CBC with Differential     Status: Abnormal   Collection Time: 02/09/15  3:27 PM  Result Value Ref Range   WBC 7.3 3.6 - 11.0 K/uL   RBC 4.15 3.80 - 5.20 MIL/uL   Hemoglobin 11.0 (L) 12.0 - 16.0 g/dL   HCT 35.1 35.0 - 47.0 %   MCV 84.8 80.0 - 100.0 fL   MCH 26.4 26.0 - 34.0 pg   MCHC 31.2 (L) 32.0 - 36.0 g/dL   RDW 18.5 (H) 11.5 - 14.5 %   Platelets 297 150 - 440 K/uL   Neutrophils Relative % 73 %   Neutro Abs 5.4 1.4 - 6.5 K/uL   Lymphocytes Relative 12 %   Lymphs Abs 0.9 (L) 1.0 - 3.6 K/uL   Monocytes Relative 8 %   Monocytes Absolute 0.6 0.2 - 0.9 K/uL   Eosinophils Relative 6 %   Eosinophils Absolute 0.4 0 - 0.7 K/uL   Basophils Relative 1 %   Basophils Absolute 0.1 0 - 0.1 K/uL  Glucose, capillary     Status: Abnormal   Collection Time: 02/09/15  6:24 PM  Result Value Ref Range   Glucose-Capillary 129 (H) 65 - 99 mg/dL   Comment 1 Notify RN   Type and screen     Status: None   Collection Time: 02/09/15  6:31 PM  Result Value Ref Range   ABO/RH(D) O POS    Antibody Screen NEG    Sample Expiration 02/12/2015   ABO/Rh     Status: None   Collection Time: 02/09/15  6:32 PM  Result Value Ref Range   ABO/RH(D) O POS   Urinalysis complete, with microscopic ([facility] only)     Status: Abnormal   Collection Time: 02/09/15  6:47 PM  Result Value Ref Range   Color, Urine YELLOW (A) YELLOW   APPearance HAZY (A) CLEAR   Glucose, UA NEGATIVE NEGATIVE mg/dL   Bilirubin Urine NEGATIVE NEGATIVE   Ketones, ur NEGATIVE NEGATIVE mg/dL   Specific Gravity, Urine 1.019 1.005 - 1.030   Hgb urine dipstick NEGATIVE NEGATIVE   pH 5.0 5.0 - 8.0   Protein, ur NEGATIVE NEGATIVE mg/dL   Nitrite NEGATIVE NEGATIVE   Leukocytes, UA NEGATIVE NEGATIVE   RBC / HPF 0-5 0 - 5 RBC/hpf   WBC, UA 0-5 0 - 5 WBC/hpf   Bacteria, UA RARE (A) NONE SEEN   Squamous Epithelial / LPF NONE SEEN NONE SEEN   Mucous PRESENT    Hyaline Casts, UA PRESENT   Protime-INR     Status: None   Collection Time: 02/09/15  6:49 PM  Result Value Ref Range   Prothrombin Time 13.6 11.4 - 15.0 seconds   INR 1.02   APTT     Status: Abnormal   Collection Time: 02/09/15  6:49 PM  Result Value Ref Range   aPTT 23 (L) 24 - 36 seconds  Albumin     Status: Abnormal   Collection Time: 02/09/15  6:49 PM  Result Value Ref Range   Albumin 3.3 (L) 3.5 - 5.0 g/dL  Vit D  25 hydroxy (rtn osteoporosis monitoring)     Status: None   Collection Time: 02/09/15  6:49 PM  Result Value Ref Range   Vit D, 25-Hydroxy 32.2 30.0 - 100.0 ng/mL  Glucose, capillary     Status: Abnormal   Collection Time:  02/09/15  8:49 PM  Result Value Ref Range   Glucose-Capillary 335 (H) 65 - 99 mg/dL  Glucose, capillary     Status: Abnormal   Collection Time: 02/10/15  2:09 AM  Result Value Ref Range   Glucose-Capillary 247 (H) 65 - 99 mg/dL  CBC     Status: Abnormal   Collection Time: 02/10/15  4:39 AM  Result Value Ref Range   WBC 6.9 3.6 - 11.0 K/uL   RBC 3.30 (L) 3.80 - 5.20 MIL/uL   Hemoglobin 8.9 (L) 12.0 - 16.0 g/dL   HCT 27.9 (L) 35.0 - 47.0 %   MCV 84.4 80.0 - 100.0 fL   MCH 26.9 26.0 - 34.0 pg   MCHC 31.9 (L) 32.0 - 36.0 g/dL   RDW 17.9 (H) 11.5 - 14.5 %   Platelets 212 150 - 440 K/uL  Basic metabolic panel     Status: Abnormal   Collection Time: 02/10/15  4:39 AM  Result Value Ref Range   Sodium 138 135 - 145 mmol/L   Potassium 4.0 3.5 - 5.1 mmol/L   Chloride 105 101 - 111 mmol/L   CO2 27 22 - 32 mmol/L   Glucose, Bld 276 (H) 65 - 99 mg/dL   BUN 26 (H) 6 - 20 mg/dL   Creatinine, Ser 0.83 0.44 - 1.00 mg/dL   Calcium 8.4 (L) 8.9 - 10.3 mg/dL   GFR calc non Af Amer >60 >60 mL/min   GFR calc Af Amer >60 >60 mL/min   Anion gap 6 5 - 15  Glucose, capillary     Status: Abnormal   Collection Time: 02/10/15  7:44 AM  Result Value Ref Range   Glucose-Capillary 259 (H) 65 - 99 mg/dL   Comment 1 Notify RN     Dg Chest 1 View  02/09/2015   CLINICAL DATA:  Fall, weakness  EXAM: CHEST  1 VIEW  COMPARISON:  07/14/2014  FINDINGS: Volume loss in the right hemithorax. Right apical pleural parenchymal scarring with upward hilar retraction.  Chronic interstitial markings/emphysematous changes. No focal consolidation.  Right pleural fluid/ thickening.  No pneumothorax.  The heart is normal in size.  IMPRESSION: Volume loss in the right hemithorax. Right apical pleural-parenchymal scarring with upward hilar retraction.  No evidence of acute cardiopulmonary disease.   Electronically Signed   By: Julian Hy M.D.   On: 02/09/2015 16:34   Dg Hip Unilat  With Pelvis 2-3 Views Right  02/09/2015    CLINICAL DATA:  Patient status post fall. Right hip pain. Unable to move right leg.  EXAM: DG HIP (WITH OR WITHOUT PELVIS) 2-3V RIGHT  COMPARISON:  None.  FINDINGS: Findings compatible with a displaced intertrochanteric right femur fracture. Lower lumbar spine degenerative changes. Bilateral hip joint degenerative changes.  IMPRESSION: Displaced right intertrochanteric femur fracture.   Electronically Signed   By: Lovey Newcomer M.D.   On: 02/09/2015 16:35    Assessment/Plan:     Active Problems:   Hip fracture  Patient has been cleared medically for surgery. Plan is for surgical fixation of the right hip fracture today.    Thornton Park , MD 02/10/2015, 9:04 AM

## 2015-02-10 NOTE — Progress Notes (Signed)
[facility] [place] NAME: Sophee Mckimmy    MR#:  [id number]  DATE OF BIRTH:  [birth year]  SUBJECTIVE:  Getting ready to go for surgery. No complaints of cp or sob Came in after mechanical fall at [facility]  REVIEW OF SYSTEMS:   Review of Systems  Constitutional: Negative for fever, chills and weight loss.  HENT: Negative for ear discharge, ear pain and nosebleeds.   Eyes: Negative for blurred vision, pain and discharge.  Respiratory: Negative for sputum production, shortness of breath, wheezing and stridor.   Cardiovascular: Negative for chest pain, palpitations, orthopnea and PND.  Gastrointestinal: Negative for nausea, vomiting, abdominal pain and diarrhea.  Genitourinary: Negative for urgency and frequency.  Musculoskeletal: Positive for joint pain. Negative for back pain.  Neurological: Negative for sensory change, speech change, focal weakness and weakness.  Psychiatric/Behavioral: Negative for depression and hallucinations. The patient is not nervous/anxious.   All other systems reviewed and are negative.  Tolerating Diet:npo Tolerating PT: eval pending  DRUG ALLERGIES:   Allergies  Allergen Reactions  . Augmentin [Amoxicillin-Pot Clavulanate] Diarrhea  . Celexa [Citalopram] Itching  . Iodine Other (See Comments)    Reaction:  Unknown   . Minocycline Other (See Comments)    Reaction:  Unknown   . Sulfa Antibiotics Itching    VITALS:  Blood pressure 123/56, pulse 98, temperature 98.7 F (37.1 C), temperature source Oral, resp. rate 18, height 5\' 2"  (1.575 m), weight 48.172 kg (106 lb 3.2 oz), SpO2 98 %.  PHYSICAL EXAMINATION:   Physical Exam  GENERAL:  81 y.o.-year-old patient lying in the bed with no acute distress.  EYES: Pupils equal, round, reactive to light and accommodation. No scleral icterus. Extraocular muscles intact.  HEENT: Head atraumatic, normocephalic. Oropharynx and nasopharynx clear.   NECK:  Supple, no jugular venous distention. No thyroid enlargement, no tenderness.  LUNGS: Normal breath sounds bilaterally, no wheezing, rales, rhonchi. No use of accessory muscles of respiration.  CARDIOVASCULAR: S1, S2 normal. No murmurs, rubs, or gallops.  ABDOMEN: Soft, nontender, nondistended. Bowel sounds present. No organomegaly or mass.  EXTREMITIES: No cyanosis, clubbing or edema b/l.    NEUROLOGIC: Cranial nerves II through XII are intact. No focal Motor or sensory deficits b/l.   PSYCHIATRIC: The patient is alert and oriented x 3.  SKIN: No obvious rash, lesion, or ulcer.    LABORATORY PANEL:   CBC  Recent Labs Lab 02/10/15 0439  WBC 6.9  HGB 8.9*  HCT 27.9*  PLT 212    Chemistries   Recent Labs Lab 02/10/15 0439  NA 138  K 4.0  CL 105  CO2 27  GLUCOSE 276*  BUN 26*  CREATININE 0.83  CALCIUM 8.4*    Cardiac Enzymes No results for input(s): TROPONINI in the last 168 hours.  RADIOLOGY:  Dg Chest 1 View  02/09/2015   CLINICAL DATA:  Fall, weakness  EXAM: CHEST  1 VIEW  COMPARISON:  07/14/2014  FINDINGS: Volume loss in the right hemithorax. Right apical pleural parenchymal scarring with upward hilar retraction.  Chronic interstitial markings/emphysematous changes. No focal consolidation.  Right pleural fluid/ thickening.  No pneumothorax.  The heart is normal in size.  IMPRESSION: Volume loss in the right hemithorax. Right apical pleural-parenchymal scarring with upward hilar retraction.  No evidence of acute cardiopulmonary disease.   Electronically Signed   By: Julian Hy M.D.   On: 02/09/2015 16:34   Dg Hip Unilat  With Pelvis 2-3  Views Right  02/09/2015   CLINICAL DATA:  Patient status post fall. Right hip pain. Unable to move right leg.  EXAM: DG HIP (WITH OR WITHOUT PELVIS) 2-3V RIGHT  COMPARISON:  None.  FINDINGS: Findings compatible with a displaced intertrochanteric right femur fracture. Lower lumbar spine degenerative changes. Bilateral hip  joint degenerative changes.  IMPRESSION: Displaced right intertrochanteric femur fracture.   Electronically Signed   By: Lovey Newcomer M.D.   On: 02/09/2015 16:35     ASSESSMENT AND PLAN:   81 year old status post fall with right hip fracture  1. Right hip fracture: - orthopedic evaluation with Dr Raliegh Ip noted. For surgery today - Preoperatively patient has no cardiopulmonary symptoms and is cleared for surgery no further workup needed  2. Diabetes type 2  -will hold her insulin place her on sliding scale till after surgery  3. Depression and anxiety  -symptoms table and pt not taking Paxil  4. Miscellaneous recommend incentive spirometry and DVT prophylaxis postop  Case discussed with Care Management/Social Worker. Management plans discussed with the patient, family and they are in agreement.  CODE STATUS: full  DVT Prophylaxis: per ortho  TOTAL TIME TAKING CARE OF THIS PATIENT: 40 minutes.  >50% time spent on counselling and coordination of care  POSSIBLE D/C IN 2-3 DAYS, DEPENDING ON CLINICAL CONDITION.   Ceaira Ernster M.D on 02/10/2015 at 11:40 AM  Between 7am to 6pm - Pager - [phone]  After 6pm go to [email or web address] - password [email or web address] [facility]  Office  [phone]  CC: Primary care physician; Crecencio Mc, MD

## 2015-02-10 NOTE — Progress Notes (Signed)
Subjective:  POST-OP CHECK:  Patient reports pain as mild.  Patient is sitting up in bed. She is feeling somewhat overwhelmed postop.  She states she wishes she was at home. Medically there are no issues. Patient was seen with her nurse.  Objective:   VITALS:   Filed Vitals:   02/10/15 1608 02/10/15 1733 02/10/15 1733 02/10/15 1826  BP: 119/76 123/48  131/50  Pulse: 108 109 107 105  Temp: 98.4 F (36.9 C) 98.5 F (36.9 C)  98.4 F (36.9 C)  TempSrc: Oral Oral  Oral  Resp: 18 18  18   Height:      Weight:      SpO2: 100% 98% 97% 98%   Physical exam: Right lower extremity: Neurovascular intact Sensation intact distally Intact pulses distally Dorsiflexion/Plantar flexion intact Incision: dressing C/D/I No cellulitis present Compartment soft  LABS  Results for orders placed or performed during the hospital encounter of 02/09/15 (from the past 24 hour(s))  Urine culture     Status: None (Preliminary result)   Collection Time: 02/09/15  6:47 PM  Result Value Ref Range   Specimen Description URINE, CLEAN CATCH    Special Requests NONE    Culture      >=100,000 COLONIES/mL GRAM NEGATIVE RODS IDENTIFICATION AND SUSCEPTIBILITIES TO FOLLOW    Report Status PENDING   Urinalysis complete, with microscopic ([facility] only)     Status: Abnormal   Collection Time: 02/09/15  6:47 PM  Result Value Ref Range   Color, Urine YELLOW (A) YELLOW   APPearance HAZY (A) CLEAR   Glucose, UA NEGATIVE NEGATIVE mg/dL   Bilirubin Urine NEGATIVE NEGATIVE   Ketones, ur NEGATIVE NEGATIVE mg/dL   Specific Gravity, Urine 1.019 1.005 - 1.030   Hgb urine dipstick NEGATIVE NEGATIVE   pH 5.0 5.0 - 8.0   Protein, ur NEGATIVE NEGATIVE mg/dL   Nitrite NEGATIVE NEGATIVE   Leukocytes, UA NEGATIVE NEGATIVE   RBC / HPF 0-5 0 - 5 RBC/hpf   WBC, UA 0-5 0 - 5 WBC/hpf   Bacteria, UA RARE (A) NONE SEEN   Squamous Epithelial / LPF NONE SEEN NONE SEEN   Mucous PRESENT    Hyaline Casts, UA PRESENT   Protime-INR      Status: None   Collection Time: 02/09/15  6:49 PM  Result Value Ref Range   Prothrombin Time 13.6 11.4 - 15.0 seconds   INR 1.02   APTT     Status: Abnormal   Collection Time: 02/09/15  6:49 PM  Result Value Ref Range   aPTT 23 (L) 24 - 36 seconds  Albumin     Status: Abnormal   Collection Time: 02/09/15  6:49 PM  Result Value Ref Range   Albumin 3.3 (L) 3.5 - 5.0 g/dL  Vit D  25 hydroxy (rtn osteoporosis monitoring)     Status: None   Collection Time: 02/09/15  6:49 PM  Result Value Ref Range   Vit D, 25-Hydroxy 32.2 30.0 - 100.0 ng/mL  Glucose, capillary     Status: Abnormal   Collection Time: 02/09/15  8:49 PM  Result Value Ref Range   Glucose-Capillary 335 (H) 65 - 99 mg/dL  Glucose, capillary     Status: Abnormal   Collection Time: 02/10/15  2:09 AM  Result Value Ref Range   Glucose-Capillary 247 (H) 65 - 99 mg/dL  CBC     Status: Abnormal   Collection Time: 02/10/15  4:39 AM  Result Value Ref Range   WBC 6.9 3.6 -  11.0 K/uL   RBC 3.30 (L) 3.80 - 5.20 MIL/uL   Hemoglobin 8.9 (L) 12.0 - 16.0 g/dL   HCT 27.9 (L) 35.0 - 47.0 %   MCV 84.4 80.0 - 100.0 fL   MCH 26.9 26.0 - 34.0 pg   MCHC 31.9 (L) 32.0 - 36.0 g/dL   RDW 17.9 (H) 11.5 - 14.5 %   Platelets 212 150 - 440 K/uL  Basic metabolic panel     Status: Abnormal   Collection Time: 02/10/15  4:39 AM  Result Value Ref Range   Sodium 138 135 - 145 mmol/L   Potassium 4.0 3.5 - 5.1 mmol/L   Chloride 105 101 - 111 mmol/L   CO2 27 22 - 32 mmol/L   Glucose, Bld 276 (H) 65 - 99 mg/dL   BUN 26 (H) 6 - 20 mg/dL   Creatinine, Ser 0.83 0.44 - 1.00 mg/dL   Calcium 8.4 (L) 8.9 - 10.3 mg/dL   GFR calc non Af Amer >60 >60 mL/min   GFR calc Af Amer >60 >60 mL/min   Anion gap 6 5 - 15  Glucose, capillary     Status: Abnormal   Collection Time: 02/10/15  7:44 AM  Result Value Ref Range   Glucose-Capillary 259 (H) 65 - 99 mg/dL   Comment 1 Notify RN   Glucose, capillary     Status: Abnormal   Collection Time: 02/10/15  4:06  PM  Result Value Ref Range   Glucose-Capillary 288 (H) 65 - 99 mg/dL   Comment 1 Notify RN     Dg Chest 1 View  02/09/2015   CLINICAL DATA:  Fall, weakness  EXAM: CHEST  1 VIEW  COMPARISON:  07/14/2014  FINDINGS: Volume loss in the right hemithorax. Right apical pleural parenchymal scarring with upward hilar retraction.  Chronic interstitial markings/emphysematous changes. No focal consolidation.  Right pleural fluid/ thickening.  No pneumothorax.  The heart is normal in size.  IMPRESSION: Volume loss in the right hemithorax. Right apical pleural-parenchymal scarring with upward hilar retraction.  No evidence of acute cardiopulmonary disease.   Electronically Signed   By: Julian Hy M.D.   On: 02/09/2015 16:34   Dg Hip Port Unilat With Pelvis 1v Right  02/10/2015   CLINICAL DATA:  Postop fracture fixation.  EXAM: DG HIP (WITH OR WITHOUT PELVIS) 1V PORT RIGHT  COMPARISON:  02/09/2015  FINDINGS: There is an intra medullary gamma nail with a proximal dynamic screw and distal interlocking screw transfixing the intertrochanteric fracture. Near anatomic reduction. No complicating features. Stable advanced degenerative changes involving the left hip. No definite pelvic fractures.  IMPRESSION: Closed reduction and internal fixation of an intertrochanteric fracture. Good position/alignment without complicating features.   Electronically Signed   By: Marijo Sanes M.D.   On: 02/10/2015 13:16   Dg Hip Operative Unilat With Pelvis Right  02/10/2015   CLINICAL DATA:  Intertrochanteric fracture of the proximal right femur.  EXAM: OPERATIVE RIGHT HIP (WITH PELVIS IF PERFORMED) 2 VIEWS  TECHNIQUE: Fluoroscopic spot image(s) were submitted for interpretation post-operatively.  FLUOROSCOPY TIME:  43 SECONDS  COMPARISON:  Radiographs dated 02/09/2015  FINDINGS: AP and lateral C-arm images demonstrate the patient has undergone open reduction and internal fixation of the intertrochanteric fracture of the proximal  right femur. Intra medullary rod and compression screw are in place. Alignment and position of the major fragments is anatomic. Portions of the greater and lesser trochanters are avulsed.  IMPRESSION: Open reduction and internal fixation of right hip fracture.  Electronically Signed   By: Lorriane Shire M.D.   On: 02/10/2015 12:17   Dg Hip Unilat  With Pelvis 2-3 Views Right  02/09/2015   CLINICAL DATA:  Patient status post fall. Right hip pain. Unable to move right leg.  EXAM: DG HIP (WITH OR WITHOUT PELVIS) 2-3V RIGHT  COMPARISON:  None.  FINDINGS: Findings compatible with a displaced intertrochanteric right femur fracture. Lower lumbar spine degenerative changes. Bilateral hip joint degenerative changes.  IMPRESSION: Displaced right intertrochanteric femur fracture.   Electronically Signed   By: Lovey Newcomer M.D.   On: 02/09/2015 16:35    Assessment/Plan: Day of Surgery   Active Problems:   Hip fracture   Malnutrition of moderate degree  Patient doing well orthopedically postop. Her pain is controlled. She will begin physical and occupational therapy tomorrow. She will start Lovenox tomorrow for DVT prophylaxis. She was encouraged to continue incentive spirometry while awake. She'll complete 24 hours of postop antibiotics.    Thornton Park , MD 02/10/2015, 6:44 PM

## 2015-02-10 NOTE — Anesthesia Preprocedure Evaluation (Signed)
Anesthesia Evaluation  Patient identified by MRN, date of birth, ID band Patient awake    Reviewed: Allergy & Precautions, NPO status , Patient's Chart, lab work & pertinent test results  Airway Mallampati: I  TM Distance: >3 FB Neck ROM: Limited    Dental  (+) Teeth Intact   Pulmonary shortness of breath and with exertion, former smoker,    Pulmonary exam normal       Cardiovascular Exercise Tolerance: Poor negative cardio ROS Normal cardiovascular exam    Neuro/Psych Anxiety Depression    GI/Hepatic   Endo/Other  diabetes, Type 2, Insulin DependentBG 259.  Renal/GU      Musculoskeletal   Abdominal Normal abdominal exam  (+)  Abdomen: soft.    Peds  Hematology  (+) anemia , Hb 8.9.   Anesthesia Other Findings   Reproductive/Obstetrics                             Anesthesia Physical Anesthesia Plan  ASA: III and emergent  Anesthesia Plan: Spinal   Post-op Pain Management:    Induction:   Airway Management Planned: Simple Face Mask  Additional Equipment:   Intra-op Plan:   Post-operative Plan:   Informed Consent: I have reviewed the patients History and Physical, chart, labs and discussed the procedure including the risks, benefits and alternatives for the proposed anesthesia with the patient or authorized representative who has indicated his/her understanding and acceptance.     Plan Discussed with: CRNA  Anesthesia Plan Comments:         Anesthesia Quick Evaluation

## 2015-02-10 NOTE — Progress Notes (Signed)
Dr.Krasinski here to see patient.

## 2015-02-10 NOTE — Care Management (Signed)
Admitted to [facility] with the diagnosis of Right Hip Fracture. A resident of [facility] since 2012. Prior to living at [facility] lived in [place]. Son is Sonia Side [phone]). Home Health in [place] in the past. No skilled facility. No home oxygen. Uses a rolling walker to aid in ambulation, cane as needed. Last seen Dr. Derrel Nip about a month ago. Takes care of all basic activities of daily living herself. Niece helps with instrumental activities. Good appetite. Fell at home. NPO scheduled for surgery this morning. States she will be going to the skilled side of [place] when discharged, Shelbie Ammons RN MSN Care Management [phone]

## 2015-02-10 NOTE — Op Note (Signed)
DATE OF SURGERY:  02/10/2015  TIME: 12:35 PM  PATIENT NAME:  Penny Clements  AGE: 81 y.o.  PRE-OPERATIVE DIAGNOSIS:  Right intertrochanteric hip fracture  POST-OPERATIVE DIAGNOSIS:  SAME  PROCEDURE:  INTRAMEDULLARY (IM) NAIL FEMORAL, right hip  SURGEON:  [place]  OPERATIVE IMPLANTS: Biomet short Affixus nail 125 degree 11 x 180, 95 mm lag screw with a 34 mm distal interlocking screw  PREOPERATIVE INDICATIONS:  Penny Clements is a 80 y.o. year old who fell and suffered a right intertrochanteric hip fracture. She was brought into the ER and then admitted and medically cleared for surgical intervention.  I reviewed the patient's fracture with her. I have recommended intramedullary fixation. I reviewed the details of surgery, postoperative recovery as well as the risks and benefits.  The risks, benefits and alternatives were discussed with the patient.  The risks include but are not limited to infection, bleeding, nerve or blood vessel injury, malunion, nonunion, hardware prominence, hardware failure, change in leg lengths or lower extremity rotation need for further surgery including hardware removal with conversion to a total hip arthroplasty. Medical risks include but are not limited to DVT and pulmonary embolism, myocardial infarction, stroke, pneumonia, respiratory failure and death. The patient understood these risks and wished to proceed with surgery.  OPERATIVE PROCEDURE:  The patient was brought to the operating room and placed in the supine position on the fracture table.. General anesthesia was administered.  A closed reduction was performed under C-arm guidance.  The fracture reduction was confirmed on both AP and lateral views. A time out was performed to verify the patient's name, date of birth, medical record number, correct site of surgery correct procedure to be performed. The timeout was also used to verify the patient received antibiotics and all appropriate  instruments, implants and radiographic studies were available in the room. Once all in attendance were in agreement, the case began. The patient was prepped and draped in a sterile fashion. She received preoperative antibiotics. She received vancomycin given her allergy to Augmentin. I reviewed the patient's iodine allergy with her. She stated she had a reaction to contrast during a CAT scan. She does not recall having any adverse reaction to topical iodine. Therefore DuraPrep and an [place] surgical drape were used for this case.  An incision was made proximal to the greater trochanter in line with the femur. A guidewire was placed over the tip of the greater trochanter and advanced into the proximal femur to the level of the lesser trochanter.  Confirmation of the drill pin position was made on AP and lateral C-arm images.  The threaded guidepin was then overdrilled with the proximal femoral drill.  The nail was then inserted into the proximal femur, across the fracture site and into the femoral shaft. Its position was confirmed on AP and lateral C-arm images.   Once the nail was completely seated, the drill guide for the lag screw was placed through the guide arm for the Affixus nail. A guidepin was then placed through this drill guide and advanced through the lateral cortex of the femur, across the fracture site and into the femoral head achieving a tip apex distance of less than 25 mm. The length of the drill pin was measured to be 95 mm, and then the drill for the lag screw was advanced through the lateral cortex, across the fracture site and up into the femoral head to a depth of 95 mm  The lag screw was then advanced by  hand into position across the fracture site into the femoral head. Its final position was confirmed on AP and lateral C-arm images. Compression was applied as traction was carefully released. The set screw in the top of the intramedullary rod was tightened by hand using a screwdriver.    The drill sleeve for the distal interlocking screw was then placed through the Affixus guide arm. A small stab incision was made to allow the drill guide to approximate the lateral cortex of the femur. The drill for the distal interlocking screw was then advanced bicortically. The depth of this drill was measured to be 34 mm. A distal interlocking screw with the length measured was then inserted by hand through the guide arm. Final C-arm images of the entire intramedullary construct were taken in both the AP and lateral planes.   The wounds were irrigated copiously and closed with 0 Vicryl for closure of the deep fashion and 2-0 Vicryl for his subcutaneous closure. The skin was approximated with staples. A dry sterile dressing was applied. I was scrubbed and present the entire case and all sharp and instrument counts were correct at the conclusion of the case. Patient was transferred to hospital bed and brought to PACU in stable condition. I spoke with the patient's son in the postop consultation room to let him know the case had gone without complication and his mother was stable in the recovery room.  She will be partial weightbearing and begin physical and occupational therapy tomorrow. The patient will started on Lovenox for DVT prophylaxis tomorrow. She will remain on vancomycin for 24 hours postop.   Timoteo Gaul, MD

## 2015-02-10 NOTE — Progress Notes (Signed)
Dr. Jannifer Franklin returned paged new orders giving.

## 2015-02-10 NOTE — Anesthesia Postprocedure Evaluation (Signed)
  Anesthesia Post-op Note  Patient: Penny Clements  Procedure(s) Performed: Procedure(s): INTRAMEDULLARY (IM) NAIL FEMORAL (Right)  Anesthesia type:Spinal, MAC  Patient location: PACU  Post pain: Pain level controlled  Post assessment: Post-op Vital signs reviewed, Patient's Cardiovascular Status Stable, Respiratory Function Stable, Patent Airway and No signs of Nausea or vomiting  Post vital signs: Reviewed and stable  Last Vitals:  Filed Vitals:   02/10/15 1232  BP: 96/44  Pulse: 91  Temp:   Resp: 18    Level of consciousness: awake, alert  and patient cooperative  Complications: No apparent anesthesia complications

## 2015-02-10 NOTE — Progress Notes (Signed)
MD paged. Patient blood sugar 308 no night time coverage. Awaiting response.

## 2015-02-10 NOTE — Clinical Social Work Note (Signed)
Clinical Social Work Assessment  Patient Details  Name: Penny Clements MRN: [id number] Date of Birth: April 01, 1928  Date of referral:  02/10/15               Reason for consult:  Facility Placement                Permission sought to share information with:  Chartered certified accountant granted to share information::  Yes, Verbal Permission Granted  Name::      [place]::   [place]  Relationship::     Contact Information:     Housing/Transportation Living arrangements for the past 2 months:  [place] of Information:  Facility, Other (Comment Required) (Independent Living Resident at [facility] ) Patient Interpreter Needed:  None Criminal Activity/Legal Involvement Pertinent to Current Situation/Hospitalization:  No - Comment as needed Significant Relationships:  Adult Children Lives with:  Self, Facility Resident, Other (Comment) (Independent Living Resident ) Do you feel safe going back to the place where you live?  Yes Need for family participation in patient care:  Yes (Comment)  Care giving concerns: Patient is an Chief Strategy Officer at [employer].    Social Worker assessment / plan: Holiday representative (CSW) received SNF consult. Patient had surgery today. CSW met with patient and her son Ronald Pippins was at bedside after surgery. CSW introduced self and explained role of CSW department. Patient reported that she has been at [facility] since 2012. Patient reported that she has never been to rehab before. Per patient her son Ronald Pippins is her primary support and he lives in [place]. CSW explained that PT will work with patient tomorrow and recommend rehab or home health. Per admissions coordinator at [facility] patient can come to the [facility] on Monday 02/13/15. Patient is agreeable to going to [facility] for rehab.   FL2 complete and on chart.     Employment status:   Retired Forensic scientist:  Medicare PT Recommendations:  Not assessed at this time [place] / Referral to community resources:  [place]  Patient/Family's Response to care: Patient is agreeable to going to [employer] [facility]   Patient/Family's Understanding of and Emotional Response to Diagnosis, Current Treatment, and Prognosis: Patient and son thanked CSW for visit.   Emotional Assessment Appearance:  Appears stated age Attitude/Demeanor/Rapport:    Affect (typically observed):  Accepting, Pleasant Orientation:  Oriented to Self, Oriented to Place, Oriented to  Time, Oriented to Situation Alcohol / Substance use:  Not Applicable Psych involvement (Current and /or in the community):  No (Comment)  Discharge Needs  Concerns to be addressed:  Discharge Planning Concerns Readmission within the last 30 days:  No Current discharge risk:    Barriers to Discharge:  Continued Medical Work up   Loralyn Freshwater, LCSW 02/10/2015, 3:09 PM

## 2015-02-10 NOTE — Progress Notes (Signed)
Spoke with Dr.Krasinski regarding pt's hgb- 8.9 prior to surgery. No new orders given.
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
## 2015-02-10 NOTE — Progress Notes (Signed)
Inpatient Diabetes Program Recommendations  AACE/ADA: New Consensus Statement on Inpatient Glycemic Control (2013)  Target Ranges:  Prepandial:   less than 140 mg/dL      Peak postprandial:   less than 180 mg/dL (1-2 hours)      Critically ill patients:  140 - 180 mg/dL   Results for FAE, BLOSSOM (MRN [id number]) as of 02/10/2015 10:53  Ref. Range 02/09/2015 18:24 02/09/2015 20:49 02/10/2015 02:09 02/10/2015 07:44  Glucose-Capillary Latest Ref Range: 65-99 mg/dL 129 (H) 335 (H) 247 (H) 259 (H)   Results for SHAKESHA, SOLTAU (MRN [id number]) as of 02/10/2015 10:53  Ref. Range 12/22/2014 00:00  Hemoglobin A1C Latest Ref Range: 4.0-6.0 % 7.8 (A)    Reason for review: elevated CBG  Diabetes history: Type 2 Outpatient Diabetes medications: Novolog 4 units bid with breakfast and lunch, Lantus insulin 8 units qam, 5 units qpm  Current orders for Inpatient glycemic control: Novolog correction insulin sensitive correction 0-9 units tid with meals  Once the patient returns from surgery and is no longer NPO, patient will likely require a portion of her home meds; Consider Lantus 4 units qam, 3 units qpm,  Novolog 2 units tid with meals (continue correction insulin as ordered).  Gentry Fitz, RN, BA, MHA, CDE Diabetes Coordinator Inpatient Diabetes Program  [phone] (Team Pager) [phone] ([place]) 02/10/2015 11:00 AM
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
## 2015-02-10 NOTE — Progress Notes (Signed)
OR tech here to take patient to surgery, Abx- vanc sent with chart. Consents on chart.

## 2015-02-10 NOTE — Anesthesia Postprocedure Evaluation (Signed)
  Anesthesia Post-op Note  Patient: Penny Clements  Procedure(s) Performed: Procedure(s): INTRAMEDULLARY (IM) NAIL FEMORAL (Right)  Anesthesia type:Spinal, MAC  Patient location: PACU  Post pain: Pain level controlled  Post assessment: Post-op Vital signs reviewed, Patient's Cardiovascular Status Stable, Respiratory Function Stable, Patent Airway and No signs of Nausea or vomiting  Post vital signs: Reviewed and stable  Last Vitals:  Filed Vitals:   02/10/15 1247  BP: 106/47  Pulse: 89  Temp:   Resp: 18    Level of consciousness: awake, alert  and patient cooperative  Complications: No apparent anesthesia complications

## 2015-02-10 NOTE — Progress Notes (Signed)
Dr.Zelie Asbill here to see patient prior to patient going to surgery.

## 2015-02-10 NOTE — Anesthesia Procedure Notes (Addendum)
Spinal Patient location during procedure: OR Start time: 02/10/2015 11:00 AM End time: 02/10/2015 11:05 AM Staffing Anesthesiologist: RICE, AMY Performed by: anesthesiologist  Preanesthetic Checklist Completed: patient identified, site marked, surgical consent, pre-op evaluation, timeout performed, IV checked, risks and benefits discussed and monitors and equipment checked Spinal Block Patient position: left lateral decubitus Prep: ChloraPrep and site prepped and draped Patient monitoring: heart rate, continuous pulse ox, blood pressure and cardiac monitor Approach: right paramedian Location: L3-4 Injection technique: single-shot Needle Needle type: Introducer and Sprotte  Needle gauge: 24 G Needle length: 9 cm Additional Notes Negative paresthesia. Negative blood return. Positive free-flowing CSF. Expiration date of kit checked and confirmed. Patient tolerated procedure well, without complications.

## 2015-02-10 NOTE — Clinical Social Work Placement (Signed)
   CLINICAL SOCIAL WORK PLACEMENT  NOTE  Date:  02/10/2015  Patient Details  Name: Penny Clements MRN: [id number] Date of Birth: 1927/11/21  Clinical Social Work is seeking post-discharge placement for this patient at the [place] level of care (*CSW will initial, date and re-position this form in  chart as items are completed):  Yes   Patient/family provided with [facility] Work Department's list of facilities offering this level of care within the geographic area requested by the patient (or if unable, by the patient's family).  Yes   Patient/family informed of their freedom to choose among providers that offer the needed level of care, that participate in Medicare, Medicaid or managed care program needed by the patient, have an available bed and are willing to accept the patient.  Yes   Patient/family informed of [facility]'s ownership interest in [facility] and [facility], as well as of the fact that they are under no obligation to receive care at these facilities.  PASRR submitted to EDS on 02/10/15     PASRR number received on 02/10/15     Existing PASRR number confirmed on       FL2 transmitted to all facilities in geographic area requested by pt/family on       FL2 transmitted to all facilities within larger geographic area on       Patient informed that his/her managed care company has contracts with or will negotiate with certain facilities, including the following:        Yes   Patient/family informed of bed offers received.  Patient chooses bed at  [facility] )     Physician recommends and patient chooses bed at      Patient to be transferred to   on  .  Patient to be transferred to facility by       Patient family notified on   of transfer.  Name of family member notified:        PHYSICIAN Please sign FL2     Additional Comment:    _______________________________________________ Loralyn Freshwater,  LCSW 02/10/2015, 3:06 PM

## 2015-02-10 NOTE — Transfer of Care (Signed)
Immediate Anesthesia Transfer of Care Note  Patient: Penny Clements  Procedure(s) Performed: Procedure(s): INTRAMEDULLARY (IM) NAIL FEMORAL (Right)  Patient Location: PACU  Anesthesia Type:MAC and Spinal  Level of Consciousness: awake  Airway & Oxygen Therapy: Patient Spontanous Breathing  Post-op Assessment: Report given to RN  Post vital signs: Reviewed  Last Vitals:  Filed Vitals:   02/10/15 1217  BP: 112/91  Pulse: 90  Temp: 36.4 C  Resp: 18    Complications: No apparent anesthesia complications

## 2015-02-10 NOTE — Progress Notes (Signed)
Patient return back from surgery, post-op dressing dry,clean,intact. V/S- stable.

## 2015-02-11 LAB — BASIC METABOLIC PANEL
Anion gap: 5 (ref 5–15)
BUN: 17 mg/dL (ref 6–20)
CALCIUM: 7.8 mg/dL — AB (ref 8.9–10.3)
CO2: 25 mmol/L (ref 22–32)
CREATININE: 0.85 mg/dL (ref 0.44–1.00)
Chloride: 104 mmol/L (ref 101–111)
GLUCOSE: 367 mg/dL — AB (ref 65–99)
Potassium: 4.1 mmol/L (ref 3.5–5.1)
Sodium: 134 mmol/L — ABNORMAL LOW (ref 135–145)

## 2015-02-11 LAB — CBC
HEMATOCRIT: 25.7 % — AB (ref 35.0–47.0)
Hemoglobin: 8.1 g/dL — ABNORMAL LOW (ref 12.0–16.0)
MCH: 26.8 pg (ref 26.0–34.0)
MCHC: 31.4 g/dL — AB (ref 32.0–36.0)
MCV: 85.4 fL (ref 80.0–100.0)
PLATELETS: 173 10*3/uL (ref 150–440)
RBC: 3.01 MIL/uL — ABNORMAL LOW (ref 3.80–5.20)
RDW: 17.5 % — AB (ref 11.5–14.5)
WBC: 6.7 10*3/uL (ref 3.6–11.0)

## 2015-02-11 LAB — URINE CULTURE: Culture: >=100000

## 2015-02-11 LAB — GLUCOSE, CAPILLARY
GLUCOSE-CAPILLARY: 330 mg/dL — AB (ref 65–99)
GLUCOSE-CAPILLARY: 233 mg/dL — AB (ref 65–99)
GLUCOSE-CAPILLARY: 401 mg/dL — AB (ref 65–99)
GLUCOSE-CAPILLARY: 424 mg/dL — AB (ref 65–99)
Glucose-Capillary: 344 mg/dL — ABNORMAL HIGH (ref 65–99)

## 2015-02-11 MED ORDER — INSULIN GLARGINE 100 UNIT/ML ~~LOC~~ SOLN
8.0000 [IU] | Freq: Every morning | SUBCUTANEOUS | Status: DC
  Administered 2015-02-11 – 2015-02-12 (×2): 8 [IU] via SUBCUTANEOUS
  Filled 2015-02-11 (×3): qty 0.08

## 2015-02-11 MED ORDER — INSULIN ASPART 100 UNIT/ML FLEXPEN
4.0000 [IU] | PEN_INJECTOR | Freq: Two times a day (BID) | SUBCUTANEOUS | Status: DC
  Filled 2015-02-11: qty 3

## 2015-02-11 MED ORDER — INSULIN GLARGINE 100 UNIT/ML SOLOSTAR PEN: 5.0000 [IU] | PEN_INJECTOR | Freq: Two times a day (BID) | SUBCUTANEOUS | Status: DC

## 2015-02-11 MED ORDER — CEPHALEXIN 500 MG PO CAPS
500.0000 mg | ORAL_CAPSULE | Freq: Two times a day (BID) | ORAL | Status: DC
  Administered 2015-02-11 – 2015-02-13 (×5): 500 mg via ORAL
  Filled 2015-02-11 (×5): qty 1

## 2015-02-11 MED ORDER — ENSURE ENLIVE PO LIQD
237.0000 mL | Freq: Two times a day (BID) | ORAL | Status: DC
Start: 2015-02-11 — End: 2015-02-13
  Administered 2015-02-11 – 2015-02-12 (×3): 237 mL via ORAL

## 2015-02-11 MED ORDER — ASPIRIN EC 81 MG PO TBEC
81.0000 mg | DELAYED_RELEASE_TABLET | Freq: Every day | ORAL | Status: DC
  Administered 2015-02-11 – 2015-02-13 (×3): 81 mg via ORAL
  Filled 2015-02-11 (×3): qty 1

## 2015-02-11 MED ORDER — INSULIN ASPART 100 UNIT/ML ~~LOC~~ SOLN
4.0000 [IU] | Freq: Two times a day (BID) | SUBCUTANEOUS | Status: DC
  Administered 2015-02-11 – 2015-02-13 (×5): 4 [IU] via SUBCUTANEOUS
  Filled 2015-02-11 (×2): qty 4
  Filled 2015-02-11: qty 15
  Filled 2015-02-11 (×3): qty 4

## 2015-02-11 MED ORDER — INSULIN ASPART 100 UNIT/ML ~~LOC~~ SOLN
0.0000 [IU] | Freq: Three times a day (TID) | SUBCUTANEOUS | Status: DC
  Administered 2015-02-11: 15 [IU] via SUBCUTANEOUS
  Administered 2015-02-12: 8 [IU] via SUBCUTANEOUS
  Administered 2015-02-12: 5 [IU] via SUBCUTANEOUS
  Administered 2015-02-12: 8 [IU] via SUBCUTANEOUS
  Administered 2015-02-12: 3 [IU] via SUBCUTANEOUS
  Administered 2015-02-13 (×2): 2 [IU] via SUBCUTANEOUS
  Filled 2015-02-11: qty 2
  Filled 2015-02-11: qty 5
  Filled 2015-02-11: qty 3
  Filled 2015-02-11: qty 8
  Filled 2015-02-11: qty 2
  Filled 2015-02-11: qty 8

## 2015-02-11 MED ORDER — ALPRAZOLAM 0.25 MG PO TABS
0.2500 mg | ORAL_TABLET | Freq: Once | ORAL | Status: AC
  Administered 2015-02-11: 0.25 mg via ORAL
  Filled 2015-02-11: qty 1

## 2015-02-11 MED ORDER — INSULIN GLARGINE 100 UNIT/ML ~~LOC~~ SOLN
5.0000 [IU] | Freq: Every day | SUBCUTANEOUS | Status: DC
  Administered 2015-02-11: 5 [IU] via SUBCUTANEOUS
  Filled 2015-02-11 (×2): qty 0.05

## 2015-02-11 NOTE — Progress Notes (Signed)
Dr. Jannifer Franklin called to notify pt sugar was 401 and repeat sugar 424. Will change sliding scale order.

## 2015-02-11 NOTE — Progress Notes (Signed)
Subjective:  Postop day 1 status post intramedullary fixation for right intertrochanteric hip fracture. Patient reports pain as mild.  Patient is sitting up in bed. She has no acute complaints. Patient has concerns about taking iron because to supplement constipates her. Patient states she has been using her incentive spirometry.  Objective:   VITALS:   Filed Vitals:   02/10/15 1826 02/10/15 1933 02/11/15 0452 02/11/15 0756  BP: 131/50 135/49 128/54 136/54  Pulse: 105 106 98 102  Temp: 98.4 F (36.9 C) 98.5 F (36.9 C) 97.9 F (36.6 C) 98.3 F (36.8 C)  TempSrc: Oral Oral Oral Oral  Resp: 18 22 18 18   Height:      Weight:      SpO2: 98% 98% 100% 96%   Physical exam: Examination the right lower extremity reveals: Neurovascular intact Sensation intact distally Intact pulses distally Dorsiflexion/Plantar flexion intact Incision: dressing C/D/I No cellulitis present Compartment soft  LABS  Results for orders placed or performed during the hospital encounter of 02/09/15 (from the past 24 hour(s))  Glucose, capillary     Status: Abnormal   Collection Time: 02/10/15  4:06 PM  Result Value Ref Range   Glucose-Capillary 288 (H) 65 - 99 mg/dL   Comment 1 Notify RN   Glucose, capillary     Status: Abnormal   Collection Time: 02/10/15  9:08 PM  Result Value Ref Range   Glucose-Capillary 308 (H) 65 - 99 mg/dL   Comment 1 Notify RN   CBC     Status: Abnormal   Collection Time: 02/11/15  3:49 AM  Result Value Ref Range   WBC 6.7 3.6 - 11.0 K/uL   RBC 3.01 (L) 3.80 - 5.20 MIL/uL   Hemoglobin 8.1 (L) 12.0 - 16.0 g/dL   HCT 25.7 (L) 35.0 - 47.0 %   MCV 85.4 80.0 - 100.0 fL   MCH 26.8 26.0 - 34.0 pg   MCHC 31.4 (L) 32.0 - 36.0 g/dL   RDW 17.5 (H) 11.5 - 14.5 %   Platelets 173 150 - 440 K/uL  Basic metabolic panel     Status: Abnormal   Collection Time: 02/11/15  3:49 AM  Result Value Ref Range   Sodium 134 (L) 135 - 145 mmol/L   Potassium 4.1 3.5 - 5.1 mmol/L   Chloride  104 101 - 111 mmol/L   CO2 25 22 - 32 mmol/L   Glucose, Bld 367 (H) 65 - 99 mg/dL   BUN 17 6 - 20 mg/dL   Creatinine, Ser 0.85 0.44 - 1.00 mg/dL   Calcium 7.8 (L) 8.9 - 10.3 mg/dL   GFR calc non Af Amer >60 >60 mL/min   GFR calc Af Amer >60 >60 mL/min   Anion gap 5 5 - 15  Glucose, capillary     Status: Abnormal   Collection Time: 02/11/15  7:59 AM  Result Value Ref Range   Glucose-Capillary 344 (H) 65 - 99 mg/dL   Comment 1 Notify RN     Dg Chest 1 View  02/09/2015   CLINICAL DATA:  Fall, weakness  EXAM: CHEST  1 VIEW  COMPARISON:  07/14/2014  FINDINGS: Volume loss in the right hemithorax. Right apical pleural parenchymal scarring with upward hilar retraction.  Chronic interstitial markings/emphysematous changes. No focal consolidation.  Right pleural fluid/ thickening.  No pneumothorax.  The heart is normal in size.  IMPRESSION: Volume loss in the right hemithorax. Right apical pleural-parenchymal scarring with upward hilar retraction.  No evidence of acute cardiopulmonary disease.  Electronically Signed   By: Julian Hy M.D.   On: 02/09/2015 16:34   Dg Hip Port Unilat With Pelvis 1v Right  02/10/2015   CLINICAL DATA:  Postop fracture fixation.  EXAM: DG HIP (WITH OR WITHOUT PELVIS) 1V PORT RIGHT  COMPARISON:  02/09/2015  FINDINGS: There is an intra medullary gamma nail with a proximal dynamic screw and distal interlocking screw transfixing the intertrochanteric fracture. Near anatomic reduction. No complicating features. Stable advanced degenerative changes involving the left hip. No definite pelvic fractures.  IMPRESSION: Closed reduction and internal fixation of an intertrochanteric fracture. Good position/alignment without complicating features.   Electronically Signed   By: Marijo Sanes M.D.   On: 02/10/2015 13:16   Dg Hip Operative Unilat With Pelvis Right  02/10/2015   CLINICAL DATA:  Intertrochanteric fracture of the proximal right femur.  EXAM: OPERATIVE RIGHT HIP (WITH  PELVIS IF PERFORMED) 2 VIEWS  TECHNIQUE: Fluoroscopic spot image(s) were submitted for interpretation post-operatively.  FLUOROSCOPY TIME:  43 SECONDS  COMPARISON:  Radiographs dated 02/09/2015  FINDINGS: AP and lateral C-arm images demonstrate the patient has undergone open reduction and internal fixation of the intertrochanteric fracture of the proximal right femur. Intra medullary rod and compression screw are in place. Alignment and position of the major fragments is anatomic. Portions of the greater and lesser trochanters are avulsed.  IMPRESSION: Open reduction and internal fixation of right hip fracture.   Electronically Signed   By: Lorriane Shire M.D.   On: 02/10/2015 12:17   Dg Hip Unilat  With Pelvis 2-3 Views Right  02/09/2015   CLINICAL DATA:  Patient status post fall. Right hip pain. Unable to move right leg.  EXAM: DG HIP (WITH OR WITHOUT PELVIS) 2-3V RIGHT  COMPARISON:  None.  FINDINGS: Findings compatible with a displaced intertrochanteric right femur fracture. Lower lumbar spine degenerative changes. Bilateral hip joint degenerative changes.  IMPRESSION: Displaced right intertrochanteric femur fracture.   Electronically Signed   By: Lovey Newcomer M.D.   On: 02/09/2015 16:35    Assessment/Plan: 1 Day Post-Op   Active Problems:   Hip fracture   Malnutrition of moderate degree  Patient is stable postop. She will begin physical and occupational therapy today. She was encouraged to continue using her incentive spirometry while awake. Her hemoglobin is 8.1 today. She will have labs redrawn in the morning. I will discontinue the iron order. She will complete 24 hours of postop antibiotics. Patient will begin Lovenox for DVT prophylaxis today. Foley discontinued.    Thornton Park , MD 02/11/2015, 9:38 AM

## 2015-02-11 NOTE — Evaluation (Signed)
Occupational Therapy Evaluation Patient Details Name: Penny Clements MRN: [id number] DOB: 02-14-1928 Today's Date: 02/11/2015    History of Present Illness Patient reports she was walking back to her independent apartment at the [facility] and fell in the hallway, she does not recall specific details of the fall or what happened.  She was transported to [facility] via EMS and was admitted and underwent surgical repair for a right intertrochanteric hip fracture with a intramedullary fixation.     Clinical Impression   Patient is a 81 yo female who fell at the [place] going back to her apartment, she reports she was using her rollator.  She was admitted to [facility] and underwent surgery for a R hip fx with intramedullary fixation.  She lives alone in independent living apartment at the [facility] and was independent with her basic self care, light meal prep, light housekeeping.  She required assist for driving and shopping.  She now presents with muscle weakness, decreased transfers and functional mobility and requires increased assistance for self care tasks.  She will benefit from skilled OT to maximize her safety and independence in daily tasks.  Recommend follow up OT at SNF.      Follow Up Recommendations  SNF    Equipment Recommendations       Recommendations for Other Services       Precautions / Restrictions Precautions Precautions: Fall Restrictions Weight Bearing Restrictions: Yes RLE Weight Bearing: Partial weight bearing      Mobility Bed Mobility Overal bed mobility: Needs Assistance                Transfers Overall transfer level: Needs assistance Equipment used: Rolling walker (2 wheeled) Transfers: Sit to/from Stand Sit to Stand: Mod assist;Min assist              Balance                                            ADL Overall ADL's : Needs assistance/impaired Eating/Feeding: Independent   Grooming:  Set up   Upper Body Bathing: Set up;Minimal assitance   Lower Body Bathing: Set up;Maximal assistance   Upper Body Dressing : Set up;Minimal assistance   Lower Body Dressing: Set up;Maximal assistance   Toilet Transfer: Minimal assistance   Toileting- Clothing Manipulation and Hygiene: Moderate assistance               Vision     Perception     Praxis      Pertinent Vitals/Pain Pain Assessment: 0-10 Pain Score: 2  Pain Descriptors / Indicators: Aching     Hand Dominance     Extremity/Trunk Assessment Upper Extremity Assessment Upper Extremity Assessment: Generalized weakness   Lower Extremity Assessment Lower Extremity Assessment: Defer to PT evaluation       Communication Communication Communication: No difficulties   Cognition Arousal/Alertness: Awake/alert Behavior During Therapy: WFL for tasks assessed/performed Overall Cognitive Status: Within Functional Limits for tasks assessed                     General Comments       Exercises       Shoulder Instructions      Home Living Family/patient expects to be discharged to:: Skilled nursing facility  Additional Comments: patient is a resident of the [place].       Prior Functioning/Environment Level of Independence: Independent        Comments: assist with shopping, does not drive.      OT Diagnosis: Generalized weakness;Acute pain;Other (comment) (self care deficits following fx)   OT Problem List: Decreased strength;Pain;Decreased activity tolerance;Decreased safety awareness;Decreased knowledge of use of DME or AE   OT Treatment/Interventions: Self-care/ADL training;Therapeutic exercise;DME and/or AE instruction;Patient/family education    OT Goals(Current goals can be found in the care plan section) Acute Rehab OT Goals Patient Stated Goal: Patient wants to return to her previous level of independence, wants to be  able to perform self care, walk and light meal prep. OT Goal Formulation: With patient Time For Goal Achievement: 02/18/15 Potential to Achieve Goals: Good  OT Frequency: Min 1X/week   Barriers to D/C:            Co-evaluation              End of Session Equipment Utilized During Treatment: Gait belt;Rolling walker  Activity Tolerance: Patient tolerated treatment well Patient left: in chair;with call bell/phone within reach   Time: 1021-1100 OT Time Calculation (min): 39 min Charges:  OT General Charges $OT Visit: 1 Procedure OT Evaluation $Initial OT Evaluation Tier I: 1 Procedure OT Treatments $Self Care/Home Management : 8-22 mins G-Codes:    Levi Klaiber February 21, 2015, 11:12 AM

## 2015-02-11 NOTE — Progress Notes (Signed)
[place] [facility] [place] NAME: Penny Clements    MR#:  [id number]  DATE OF BIRTH:  March 26, 1928  SUBJECTIVE:  i had a rough day post-op y'day   REVIEW OF SYSTEMS:   Review of Systems  Constitutional: Negative for fever, chills and weight loss.  HENT: Negative for ear discharge, ear pain and nosebleeds.   Eyes: Negative for blurred vision, pain and discharge.  Respiratory: Negative for sputum production, shortness of breath, wheezing and stridor.   Cardiovascular: Negative for chest pain, palpitations, orthopnea and PND.  Gastrointestinal: Negative for nausea, vomiting, abdominal pain and diarrhea.  Genitourinary: Negative for urgency and frequency.  Musculoskeletal: Positive for joint pain. Negative for back pain.  Neurological: Negative for sensory change, speech change, focal weakness and weakness.  Psychiatric/Behavioral: Negative for depression and hallucinations. The patient is not nervous/anxious.   All other systems reviewed and are negative.  Tolerating Diet:yes Tolerating PT: eval pending  DRUG ALLERGIES:   Allergies  Allergen Reactions  . Augmentin [Amoxicillin-Pot Clavulanate] Diarrhea  . Celexa [Citalopram] Itching  . Iodine Other (See Comments)    Reaction:  Unknown   . Minocycline Other (See Comments)    Reaction:  Unknown   . Sulfa Antibiotics Itching    VITALS:  Blood pressure 136/54, pulse 102, temperature 98.3 F (36.8 C), temperature source Oral, resp. rate 18, height 5\' 2"  (1.575 m), weight 48.172 kg (106 lb 3.2 oz), SpO2 96 %.  PHYSICAL EXAMINATION:   Physical Exam  GENERAL:  81 y.o.-year-old patient lying in the bed with no acute distress.  EYES: Pupils equal, round, reactive to light and accommodation. No scleral icterus. Extraocular muscles intact.  HEENT: Head atraumatic, normocephalic. Oropharynx and nasopharynx clear.  NECK:  Supple, no jugular venous distention. No thyroid enlargement, no  tenderness.  LUNGS: Normal breath sounds bilaterally, no wheezing, rales, rhonchi. No use of accessory muscles of respiration.  CARDIOVASCULAR: S1, S2 normal. No murmurs, rubs, or gallops.  ABDOMEN: Soft, nontender, nondistended. Bowel sounds present. No organomegaly or mass.  EXTREMITIES: No cyanosis, clubbing or edema b/l.    NEUROLOGIC: Cranial nerves II through XII are intact. No focal Motor or sensory deficits b/l.   PSYCHIATRIC: The patient is alert and oriented x 3.  SKIN: No obvious rash, lesion, or ulcer.    LABORATORY PANEL:   CBC  Recent Labs Lab 02/11/15 0349  WBC 6.7  HGB 8.1*  HCT 25.7*  PLT 173    Chemistries   Recent Labs Lab 02/11/15 0349  NA 134*  K 4.1  CL 104  CO2 25  GLUCOSE 367*  BUN 17  CREATININE 0.85  CALCIUM 7.8*    Cardiac Enzymes No results for input(s): TROPONINI in the last 168 hours.  RADIOLOGY:  Dg Chest 1 View  02/09/2015   CLINICAL DATA:  Fall, weakness  EXAM: CHEST  1 VIEW  COMPARISON:  07/14/2014  FINDINGS: Volume loss in the right hemithorax. Right apical pleural parenchymal scarring with upward hilar retraction.  Chronic interstitial markings/emphysematous changes. No focal consolidation.  Right pleural fluid/ thickening.  No pneumothorax.  The heart is normal in size.  IMPRESSION: Volume loss in the right hemithorax. Right apical pleural-parenchymal scarring with upward hilar retraction.  No evidence of acute cardiopulmonary disease.   Electronically Signed   By: Julian Hy M.D.   On: 02/09/2015 16:34   Dg Hip Port Unilat With Pelvis 1v Right  02/10/2015   CLINICAL DATA:  Postop fracture fixation.  EXAM:  DG HIP (WITH OR WITHOUT PELVIS) 1V PORT RIGHT  COMPARISON:  02/09/2015  FINDINGS: There is an intra medullary gamma nail with a proximal dynamic screw and distal interlocking screw transfixing the intertrochanteric fracture. Near anatomic reduction. No complicating features. Stable advanced degenerative changes involving the  left hip. No definite pelvic fractures.  IMPRESSION: Closed reduction and internal fixation of an intertrochanteric fracture. Good position/alignment without complicating features.   Electronically Signed   By: Marijo Sanes M.D.   On: 02/10/2015 13:16   Dg Hip Operative Unilat With Pelvis Right  02/10/2015   CLINICAL DATA:  Intertrochanteric fracture of the proximal right femur.  EXAM: OPERATIVE RIGHT HIP (WITH PELVIS IF PERFORMED) 2 VIEWS  TECHNIQUE: Fluoroscopic spot image(s) were submitted for interpretation post-operatively.  FLUOROSCOPY TIME:  43 SECONDS  COMPARISON:  Radiographs dated 02/09/2015  FINDINGS: AP and lateral C-arm images demonstrate the patient has undergone open reduction and internal fixation of the intertrochanteric fracture of the proximal right femur. Intra medullary rod and compression screw are in place. Alignment and position of the major fragments is anatomic. Portions of the greater and lesser trochanters are avulsed.  IMPRESSION: Open reduction and internal fixation of right hip fracture.   Electronically Signed   By: Lorriane Shire M.D.   On: 02/10/2015 12:17   Dg Hip Unilat  With Pelvis 2-3 Views Right  02/09/2015   CLINICAL DATA:  Patient status post fall. Right hip pain. Unable to move right leg.  EXAM: DG HIP (WITH OR WITHOUT PELVIS) 2-3V RIGHT  COMPARISON:  None.  FINDINGS: Findings compatible with a displaced intertrochanteric right femur fracture. Lower lumbar spine degenerative changes. Bilateral hip joint degenerative changes.  IMPRESSION: Displaced right intertrochanteric femur fracture.   Electronically Signed   By: Lovey Newcomer M.D.   On: 02/09/2015 16:35     ASSESSMENT AND PLAN:   81 year old status post fall with right hip fracture  1. Right hip fracture: -POD#1 -doing well -foley removed Eating well, PT to be started   2. Diabetes type 2  -resume home meds  3. Depression and anxiety  -symptoms table and pt not taking Paxil  4. Miscellaneous  recommend incentive spirometry and DVT prophylaxis postop  Case discussed with Care Management/Social Worker. Management plans discussed with the patient, family and they are in agreement.  CODE STATUS: full  DVT Prophylaxis: per ortho-lovenox bid  TOTAL TIME TAKING CARE OF THIS PATIENT: 40 minutes.  >50% time spent on counselling and coordination of care  POSSIBLE D/C IN 2-3 DAYS, DEPENDING ON CLINICAL CONDITION.   Aizen Duval M.D on 02/11/2015 at 10:24 AM  Between 7am to 6pm - Pager - [phone]  After 6pm go to [email or web address] - password [email or web address] [facility]  Office  [phone]  CC: Primary care physician; Crecencio Mc, MD

## 2015-02-11 NOTE — Evaluation (Signed)
Physical Therapy Evaluation Patient Details Name: Penny Clements MRN: [id number] DOB: 05/12/1928 Today's Date: 02/11/2015   History of Present Illness  Patient reports she was walking back to her independent apartment at the [facility] and fell in the hallway, she does not recall specific details of the fall or what happened.  She was transported to [facility] via EMS and was admitted and underwent surgical repair for a right intertrochanteric hip fracture with a intramedullary fixation.    Clinical Impression  Pt initially reports minimal pain at rest, but is very anxious, fearful and pain limited with attempts at standing/walking.  She shows good effort, but with some confusion and recently decreasing activity level pt is very limited functionally with what she is able to do. She did show decent strength and quality of motion with ~10 minutes of in bed exercises apart from the exam.    Follow Up Recommendations SNF    Equipment Recommendations       Recommendations for Other Services       Precautions / Restrictions Precautions Precautions: Fall Restrictions Weight Bearing Restrictions: Yes RLE Weight Bearing: Partial weight bearing      Mobility  Bed Mobility Overal bed mobility: Needs Assistance Bed Mobility: Supine to Sit     Supine to sit: Mod assist        Transfers Overall transfer level: Needs assistance Equipment used: Rolling walker (2 wheeled) Transfers: Sit to/from Stand Sit to Stand: Min assist;Mod assist            Ambulation/Gait Ambulation/Gait assistance: Max assist;Mod assist Ambulation Distance (Feet): 5 Feet Assistive device: Rolling walker (2 wheeled)       General Gait Details: Pt struggles with limited bed to recliner "ambulation", she has a hard time with WBing, ability to use UEs appropriately on the walker and generally is fearful/anxious and limited wioth what she is able to do.  Stairs            Wheelchair Mobility    Modified Rankin (Stroke Patients Only)       Balance                                             Pertinent Vitals/Pain Pain Assessment:  (2-3/10 pain at rest, increases severely with standing) Pain Score: 2  Pain Descriptors / Indicators: [place] expects to be discharged to:: Skilled nursing facility                 Additional Comments: patient is a resident of the [place].     Prior Function Level of Independence: Independent         Comments: assist with shopping, does not drive.       Hand Dominance        Extremity/Trunk Assessment   Upper Extremity Assessment: Generalized weakness           Lower Extremity Assessment: Generalized weakness (R hip pain limited and grossly 3-/5 strength)         Communication   Communication: No difficulties  Cognition Arousal/Alertness: Awake/alert Behavior During Therapy: WFL for tasks assessed/performed Overall Cognitive Status: Within Functional Limits for tasks assessed                      General Comments      Exercises General Exercises -  Lower Extremity Ankle Circles/Pumps: AROM;10 reps Gluteal Sets: AROM;10 reps Heel Slides: AROM;10 reps Hip ABduction/ADduction: AROM;10 reps      Assessment/Plan    PT Assessment Patient needs continued PT services  PT Diagnosis Difficulty walking;Generalized weakness;Acute pain   PT Problem List Decreased strength;Decreased range of motion;Decreased activity tolerance;Decreased balance;Decreased mobility;Decreased coordination  PT Treatment Interventions Gait training;Functional mobility training;DME instruction;Therapeutic activities;Therapeutic exercise;Balance training;Neuromuscular re-education   PT Goals (Current goals can be found in the Care Plan section) Acute Rehab PT Goals Patient Stated Goal: "I need to get back to doing some walking" PT Goal Formulation: With patient Time  For Goal Achievement: 02/25/15 Potential to Achieve Goals: Fair    Frequency BID   Barriers to discharge        Co-evaluation               End of Session Equipment Utilized During Treatment: Gait belt Activity Tolerance: Patient limited by fatigue;Patient limited by pain Patient left: with chair alarm set Nurse Communication: Mobility status         Time: 3335-4562 PT Time Calculation (min) (ACUTE ONLY): 29 min   Charges:   PT Evaluation $Initial PT Evaluation Tier I: 1 Procedure PT Treatments $Therapeutic Exercise: 8-22 mins   PT G Codes:       Wayne Both, PT, DPT [phone] Kreg Shropshire 02/11/2015, 1:45 PM

## 2015-02-11 NOTE — Progress Notes (Signed)
Physical Therapy Treatment Patient Details Name: Penny Clements MRN: [id number] DOB: 1928/02/02 Today's Date: 02/11/2015    History of Present Illness R hip fx, ORIF    PT Comments    Pt shows good effort, but is still having a very hard time with mobility/ambulation.  She is less reactive with WBing through R hip but continues to lack any confidence and need direct physical assist to simply stay upright and to unweight to assist with R stance phase.   Follow Up Recommendations  SNF     Equipment Recommendations       Recommendations for Other Services       Precautions / Restrictions Precautions Precautions: Fall Restrictions RLE Weight Bearing: Partial weight bearing    Mobility  Bed Mobility Overal bed mobility: Needs Assistance Bed Mobility: Sit to Supine     Supine to sit: Mod assist        Transfers Overall transfer level: Needs assistance Equipment used: Rolling walker (2 wheeled) Transfers: Sit to/from Stand Sit to Stand: Min assist            Ambulation/Gait Ambulation/Gait assistance: Max assist;Mod assist Ambulation Distance (Feet): 5 Feet Assistive device: Rolling walker (2 wheeled)       General Gait Details: Pt struggles with limited bed to recliner "ambulation", she has a hard time with WBing, ability to use UEs appropriately on the walker and generally is fearful/anxious and limited wioth what she is able to do.   Stairs            Wheelchair Mobility    Modified Rankin (Stroke Patients Only)       Balance                                    Cognition Arousal/Alertness: Awake/alert Behavior During Therapy: WFL for tasks assessed/performed Overall Cognitive Status: Within Functional Limits for tasks assessed                      Exercises General Exercises - Lower Extremity Ankle Circles/Pumps: AROM;10 reps Gluteal Sets: AROM;10 reps Short Arc Quad: AROM;10 reps Heel Slides: AROM;10  reps Hip ABduction/ADduction: AROM;10 reps    General Comments        Pertinent Vitals/Pain Pain Assessment: 0-10 Pain Score: 8     Home Living                      Prior Function            PT Goals (current goals can now be found in the care plan section) Progress towards PT goals: Progressing toward goals    Frequency  BID    PT Plan Current plan remains appropriate    Co-evaluation             End of Session Equipment Utilized During Treatment: Gait belt Activity Tolerance: Patient limited by fatigue;Patient limited by pain Patient left: with bed alarm set     Time: 1459-1524 PT Time Calculation (min) (ACUTE ONLY): 25 min  Charges:  $Gait Training: 8-22 mins $Therapeutic Exercise: 8-22 mins                    G Codes:     Wayne Both, PT, DPT [phone]  Kreg Shropshire 02/11/2015, 6:50 PM

## 2015-02-12 LAB — GLUCOSE, CAPILLARY
GLUCOSE-CAPILLARY: 168 mg/dL — AB (ref 65–99)
GLUCOSE-CAPILLARY: 289 mg/dL — AB (ref 65–99)
GLUCOSE-CAPILLARY: 226 mg/dL — AB (ref 65–99)
Glucose-Capillary: 269 mg/dL — ABNORMAL HIGH (ref 65–99)

## 2015-02-12 LAB — CBC
HCT: 24.6 % — ABNORMAL LOW (ref 35.0–47.0)
Hemoglobin: 7.9 g/dL — ABNORMAL LOW (ref 12.0–16.0)
MCH: 26.6 pg (ref 26.0–34.0)
MCHC: 32 g/dL (ref 32.0–36.0)
MCV: 83.3 fL (ref 80.0–100.0)
PLATELETS: 201 10*3/uL (ref 150–440)
RBC: 2.96 MIL/uL — AB (ref 3.80–5.20)
RDW: 17.6 % — AB (ref 11.5–14.5)
WBC: 7.4 10*3/uL (ref 3.6–11.0)

## 2015-02-12 LAB — BASIC METABOLIC PANEL
ANION GAP: 4 — AB (ref 5–15)
BUN: 22 mg/dL — AB (ref 6–20)
CALCIUM: 8.3 mg/dL — AB (ref 8.9–10.3)
CO2: 28 mmol/L (ref 22–32)
Chloride: 105 mmol/L (ref 101–111)
Creatinine, Ser: 0.75 mg/dL (ref 0.44–1.00)
GFR calc Af Amer: >60 mL/min (ref >60)
GLUCOSE: 204 mg/dL — AB (ref 65–99)
POTASSIUM: 4 mmol/L (ref 3.5–5.1)
SODIUM: 137 mmol/L (ref 135–145)

## 2015-02-12 MED ORDER — INSULIN GLARGINE 100 UNIT/ML ~~LOC~~ SOLN
15.0000 [IU] | Freq: Every morning | SUBCUTANEOUS | Status: DC
  Administered 2015-02-13: 15 [IU] via SUBCUTANEOUS
  Filled 2015-02-12 (×2): qty 0.15

## 2015-02-12 MED ORDER — INSULIN GLARGINE 100 UNIT/ML ~~LOC~~ SOLN
8.0000 [IU] | Freq: Every day | SUBCUTANEOUS | Status: DC
  Administered 2015-02-12: 8 [IU] via SUBCUTANEOUS
  Filled 2015-02-12 (×2): qty 0.08

## 2015-02-12 NOTE — Progress Notes (Signed)
Physical Therapy Treatment Patient Details Name: Penny Clements MRN: [id number] DOB: [birth year] Today's Date: 02/12/2015    History of Present Illness R hip fx, ORIF    PT Comments    Pt does not do particularly well today.  She shows good effort with exercises and has only minimal pain with those acts, but ultimately is unable to really show a lot of improvement.  She struggles significantly with standing/ambulation and shows very little ability to effectively use UEs and R LE to take weight to step with L foot.  Pt needs max/dependent assist to get to the recliner.   Follow Up Recommendations  SNF     Equipment Recommendations       Recommendations for Other Services       Precautions / Restrictions Precautions Precautions: Fall Restrictions Weight Bearing Restrictions: Yes RLE Weight Bearing: Partial weight bearing    Mobility  Bed Mobility Overal bed mobility: Needs Assistance Bed Mobility: Sit to Supine     Supine to sit: Mod assist        Transfers Overall transfer level: Needs assistance Equipment used: Rolling walker (2 wheeled) Transfers: Sit to/from Stand Sit to Stand: Mod assist            Ambulation/Gait Ambulation/Gait assistance: Max assist;Total assist Ambulation Distance (Feet): 3 Feet Assistive device: Rolling walker (2 wheeled)       General Gait Details: Pt struggles with limited bed to recliner "ambulation", she has a hard time with WBing, ability to use UEs appropriately on the walker and generally is fearful/anxious and limited with what she is able to do. Pt actaully has a harder time today than POD1 with ability to organize shifting weight onto UEs and R to move L LE.   Stairs            Wheelchair Mobility    Modified Rankin (Stroke Patients Only)       Balance                                    Cognition Arousal/Alertness: Awake/alert Behavior During Therapy: WFL for tasks  assessed/performed Overall Cognitive Status: Within Functional Limits for tasks assessed                      Exercises General Exercises - Lower Extremity Ankle Circles/Pumps: AROM;10 reps Gluteal Sets: AROM;10 reps Short Arc Quad: AROM;10 reps Heel Slides: AROM;10 reps Hip ABduction/ADduction: AROM;10 reps    General Comments        Pertinent Vitals/Pain Pain Score: 7  (again with very little pain at rest, increases w/ ambulation)    Home Living                      Prior Function            PT Goals (current goals can now be found in the care plan section) Progress towards PT goals: Progressing toward goals    Frequency  BID    PT Plan Current plan remains appropriate    Co-evaluation             End of Session Equipment Utilized During Treatment: Gait belt Activity Tolerance: Patient limited by fatigue;Patient limited by pain Patient left: with bed alarm set     Time: 7673-4193 PT Time Calculation (min) (ACUTE ONLY): 29 min  Charges:  $Gait Training: 8-22 mins $Therapeutic Exercise: 8-22  mins                    G Codes:     Wayne Both, [place], DPT [phone]  Kreg Shropshire 02/12/2015, 12:24 PM

## 2015-02-12 NOTE — Progress Notes (Signed)
Subjective:  Post operative day #2 status post intramedullary fixation for right intertrochanteric hip fracture. Patient reports pain as mild.  Patient is up out of bed to a chair. She does have right hip pain with physical therapy but is comfortable at rest. She denies any numbness or tingling. There is no other acute issues.  Objective:   VITALS:   Filed Vitals:   02/11/15 1458 02/11/15 2029 02/12/15 0429 02/12/15 0729  BP: 118/53 128/49 132/54 111/58  Pulse: 99 110 103 101  Temp: 98.3 F (36.8 C) 98.5 F (36.9 C) 98.8 F (37.1 C) 97.7 F (36.5 C)  TempSrc: Oral Oral Oral Oral  Resp: 17 18 18 18   Height:      Weight:      SpO2: 100% 97% 99% 99%    PHYSICAL EXAM: Right lower extremity exam reveals: Neurovascular intact Sensation intact distally Intact pulses distally Dorsiflexion/Plantar flexion intact Incision: dressing C/D/I No cellulitis present Compartment soft  LABS  Results for orders placed or performed during the hospital encounter of 02/09/15 (from the past 24 hour(s))  Glucose, capillary     Status: Abnormal   Collection Time: 02/11/15  4:10 PM  Result Value Ref Range   Glucose-Capillary 233 (H) 65 - 99 mg/dL   Comment 1 Notify RN   Glucose, capillary     Status: Abnormal   Collection Time: 02/11/15  9:16 PM  Result Value Ref Range   Glucose-Capillary 401 (H) 65 - 99 mg/dL   Comment 1 Notify RN   Glucose, capillary     Status: Abnormal   Collection Time: 02/11/15  9:39 PM  Result Value Ref Range   Glucose-Capillary 424 (H) 65 - 99 mg/dL  CBC     Status: Abnormal   Collection Time: 02/12/15  3:45 AM  Result Value Ref Range   WBC 7.4 3.6 - 11.0 K/uL   RBC 2.96 (L) 3.80 - 5.20 MIL/uL   Hemoglobin 7.9 (L) 12.0 - 16.0 g/dL   HCT 24.6 (L) 35.0 - 47.0 %   MCV 83.3 80.0 - 100.0 fL   MCH 26.6 26.0 - 34.0 pg   MCHC 32.0 32.0 - 36.0 g/dL   RDW 17.6 (H) 11.5 - 14.5 %   Platelets 201 150 - 440 K/uL  Basic metabolic panel     Status: Abnormal   Collection  Time: 02/12/15  3:45 AM  Result Value Ref Range   Sodium 137 135 - 145 mmol/L   Potassium 4.0 3.5 - 5.1 mmol/L   Chloride 105 101 - 111 mmol/L   CO2 28 22 - 32 mmol/L   Glucose, Bld 204 (H) 65 - 99 mg/dL   BUN 22 (H) 6 - 20 mg/dL   Creatinine, Ser 0.75 0.44 - 1.00 mg/dL   Calcium 8.3 (L) 8.9 - 10.3 mg/dL   GFR calc non Af Amer >60 >60 mL/min   GFR calc Af Amer >60 >60 mL/min   Anion gap 4 (L) 5 - 15  Glucose, capillary     Status: Abnormal   Collection Time: 02/12/15  7:30 AM  Result Value Ref Range   Glucose-Capillary 168 (H) 65 - 99 mg/dL   Comment 1 Notify RN   Glucose, capillary     Status: Abnormal   Collection Time: 02/12/15 11:01 AM  Result Value Ref Range   Glucose-Capillary 269 (H) 65 - 99 mg/dL   Comment 1 Notify RN     Dg Hip Port Unilat With Pelvis 1v Right  02/10/2015   CLINICAL  DATA:  Postop fracture fixation.  EXAM: DG HIP (WITH OR WITHOUT PELVIS) 1V PORT RIGHT  COMPARISON:  02/09/2015  FINDINGS: There is an intra medullary gamma nail with a proximal dynamic screw and distal interlocking screw transfixing the intertrochanteric fracture. Near anatomic reduction. No complicating features. Stable advanced degenerative changes involving the left hip. No definite pelvic fractures.  IMPRESSION: Closed reduction and internal fixation of an intertrochanteric fracture. Good position/alignment without complicating features.   Electronically Signed   By: Marijo Sanes M.D.   On: 02/10/2015 13:16   Dg Hip Operative Unilat With Pelvis Right  02/10/2015   CLINICAL DATA:  Intertrochanteric fracture of the proximal right femur.  EXAM: OPERATIVE RIGHT HIP (WITH PELVIS IF PERFORMED) 2 VIEWS  TECHNIQUE: Fluoroscopic spot image(s) were submitted for interpretation post-operatively.  FLUOROSCOPY TIME:  43 SECONDS  COMPARISON:  Radiographs dated 02/09/2015  FINDINGS: AP and lateral C-arm images demonstrate the patient has undergone open reduction and internal fixation of the intertrochanteric  fracture of the proximal right femur. Intra medullary rod and compression screw are in place. Alignment and position of the major fragments is anatomic. Portions of the greater and lesser trochanters are avulsed.  IMPRESSION: Open reduction and internal fixation of right hip fracture.   Electronically Signed   By: Lorriane Shire M.D.   On: 02/10/2015 12:17    Assessment/Plan: 2 Days Post-Op   Active Problems:   Hip fracture   Malnutrition of moderate degree  Patient is doing well postop. Hemoglobin is slightly lower but remained stable. No transfusion at this time. Will recheck labs in the morning. Continue physical and occupational therapy.    Thornton Park , MD 02/12/2015, 11:38 AM

## 2015-02-12 NOTE — Care Management Note (Signed)
Case Management Note  Patient Details  Name: Penny Clements MRN: [id number] Date of Birth: Mar 13, 1928  Subjective/Objective:    At this point PT is recommending SNF. Case management will follow for discharge planning if Penny Clements is discharged to home instead of SNF.                Action/Plan:   Expected Discharge Date:  02/13/15               Expected Discharge Plan:     In-House Referral:     Discharge planning Services     Post Acute Care Choice:    Choice offered to:     DME Arranged:    DME Agency:     HH Arranged:    HH Agency:     Status of Service:     Medicare Important Message Given:    Date Medicare IM Given:    Medicare IM give by:    Date Additional Medicare IM Given:    Additional Medicare Important Message give by:     If discussed at [place] of Stay Meetings, dates discussed:    Additional Comments:  Karli Wickizer A, RN 02/12/2015, 8:49 AM

## 2015-02-12 NOTE — Progress Notes (Signed)
[facility] [place] NAME: Penny Clements    MR#:  [id number]  DATE OF BIRTH:  10/29/1927  SUBJECTIVE:  Out in the chair. Doing ok   REVIEW OF SYSTEMS:   Review of Systems  Constitutional: Negative for fever, chills and weight loss.  HENT: Negative for ear discharge, ear pain and nosebleeds.   Eyes: Negative for blurred vision, pain and discharge.  Respiratory: Negative for sputum production, shortness of breath, wheezing and stridor.   Cardiovascular: Negative for chest pain, palpitations, orthopnea and PND.  Gastrointestinal: Negative for nausea, vomiting, abdominal pain and diarrhea.  Genitourinary: Negative for urgency and frequency.  Musculoskeletal: Positive for joint pain. Negative for back pain.  Neurological: Negative for sensory change, speech change, focal weakness and weakness.  Psychiatric/Behavioral: Negative for depression and hallucinations. The patient is not nervous/anxious.   All other systems reviewed and are negative.  Tolerating Diet:yes Tolerating PT: STR  DRUG ALLERGIES:   Allergies  Allergen Reactions  . Augmentin [Amoxicillin-Pot Clavulanate] Diarrhea  . Celexa [Citalopram] Itching  . Iodine Other (See Comments)    Reaction:  Unknown   . Minocycline Other (See Comments)    Reaction:  Unknown   . Sulfa Antibiotics Itching    VITALS:  Blood pressure 111/58, pulse 101, temperature 97.7 F (36.5 C), temperature source Oral, resp. rate 18, height 5\' 2"  (1.575 m), weight 48.172 kg (106 lb 3.2 oz), SpO2 99 %.  PHYSICAL EXAMINATION:   Physical Exam  GENERAL:  81 y.o.-year-old patient lying in the bed with no acute distress.  EYES: Pupils equal, round, reactive to light and accommodation. No scleral icterus. Extraocular muscles intact.  HEENT: Head atraumatic, normocephalic. Oropharynx and nasopharynx clear.  NECK:  Supple, no jugular venous distention. No thyroid enlargement, no tenderness.  LUNGS:  Normal breath sounds bilaterally, no wheezing, rales, rhonchi. No use of accessory muscles of respiration.  CARDIOVASCULAR: S1, S2 normal. No murmurs, rubs, or gallops.  ABDOMEN: Soft, nontender, nondistended. Bowel sounds present. No organomegaly or mass.  EXTREMITIES: No cyanosis, clubbing or edema b/l.    NEUROLOGIC: Cranial nerves II through XII are intact. No focal Motor or sensory deficits b/l.   PSYCHIATRIC: The patient is alert and oriented x 3.  SKIN: No obvious rash, lesion, or ulcer.   LABORATORY PANEL:   CBC  Recent Labs Lab 02/12/15 0345  WBC 7.4  HGB 7.9*  HCT 24.6*  PLT 201    Chemistries   Recent Labs Lab 02/12/15 0345  NA 137  K 4.0  CL 105  CO2 28  GLUCOSE 204*  BUN 22*  CREATININE 0.75  CALCIUM 8.3*    Cardiac Enzymes No results for input(s): TROPONINI in the last 168 hours.  RADIOLOGY:  Dg Hip Port Unilat With Pelvis 1v Right  02/10/2015   CLINICAL DATA:  Postop fracture fixation.  EXAM: DG HIP (WITH OR WITHOUT PELVIS) 1V PORT RIGHT  COMPARISON:  02/09/2015  FINDINGS: There is an intra medullary gamma nail with a proximal dynamic screw and distal interlocking screw transfixing the intertrochanteric fracture. Near anatomic reduction. No complicating features. Stable advanced degenerative changes involving the left hip. No definite pelvic fractures.  IMPRESSION: Closed reduction and internal fixation of an intertrochanteric fracture. Good position/alignment without complicating features.   Electronically Signed   By: Marijo Sanes M.D.   On: 02/10/2015 13:16   Dg Hip Operative Unilat With Pelvis Right  02/10/2015   CLINICAL DATA:  Intertrochanteric fracture of the proximal right  femur.  EXAM: OPERATIVE RIGHT HIP (WITH PELVIS IF PERFORMED) 2 VIEWS  TECHNIQUE: Fluoroscopic spot image(s) were submitted for interpretation post-operatively.  FLUOROSCOPY TIME:  43 SECONDS  COMPARISON:  Radiographs dated 02/09/2015  FINDINGS: AP and lateral C-arm images  demonstrate the patient has undergone open reduction and internal fixation of the intertrochanteric fracture of the proximal right femur. Intra medullary rod and compression screw are in place. Alignment and position of the major fragments is anatomic. Portions of the greater and lesser trochanters are avulsed.  IMPRESSION: Open reduction and internal fixation of right hip fracture.   Electronically Signed   By: Lorriane Shire M.D.   On: 02/10/2015 12:17     ASSESSMENT AND PLAN:   81 year old status post fall with right hip fracture  1. Right hip fracture: -POD#2 -doing well -foley removed doing well Eating well, PT recommends STR   2. Diabetes type 2  -resume home meds  3. Depression and anxiety  -symptoms table and pt not taking Paxil  4. Miscellaneous recommend incentive spirometry and DVT prophylaxis postop  Case discussed with Care Management/Social Worker. Management plans discussed with the patient, family and they are in agreement.  CODE STATUS: full  DVT Prophylaxis: per ortho-lovenox bid  TOTAL TIME TAKING CARE OF THIS PATIENT: 40 minutes.  >50% time spent on counselling and coordination of care  POSSIBLE D/C IN 2-3 DAYS, DEPENDING ON CLINICAL CONDITION.   Takirah Binford M.D on 02/12/2015 at 10:55 AM  Between 7am to 6pm - Pager - [phone]  After 6pm go to [email or web address] - password [email or web address] [facility]  Office  [phone]  CC: Primary care physician; Crecencio Mc, MD

## 2015-02-13 ENCOUNTER — Encounter
Admission: RE | Admit: 2015-02-13 | Discharge: 2015-02-13 | Disposition: A | Discharge status: Home / Self Care | Payer: No Typology Code available for payment source | Source: Ambulatory Visit | Attending: Internal Medicine | Admitting: Internal Medicine

## 2015-02-13 ENCOUNTER — Telehealth: Payer: Self-pay | Admitting: *Deleted

## 2015-02-13 DIAGNOSIS — D649 Anemia, unspecified: Secondary | ICD-10-CM | POA: Insufficient documentation

## 2015-02-13 LAB — GLUCOSE, CAPILLARY
GLUCOSE-CAPILLARY: 135 mg/dL — AB (ref 65–99)
GLUCOSE-CAPILLARY: 131 mg/dL — AB (ref 65–99)
Glucose-Capillary: 143 mg/dL — ABNORMAL HIGH (ref 65–99)

## 2015-02-13 LAB — TYPE AND SCREEN
ABO/RH(D): O POS
Antibody Screen: NEGATIVE
Unit division: 0
Unit division: 0

## 2015-02-13 LAB — CBC
HCT: 23.1 % — ABNORMAL LOW (ref 35.0–47.0)
HEMOGLOBIN: 7.4 g/dL — AB (ref 12.0–16.0)
MCH: 26.8 pg (ref 26.0–34.0)
MCHC: 32.1 g/dL (ref 32.0–36.0)
MCV: 83.5 fL (ref 80.0–100.0)
PLATELETS: 201 10*3/uL (ref 150–440)
RBC: 2.76 MIL/uL — AB (ref 3.80–5.20)
RDW: 17.3 % — ABNORMAL HIGH (ref 11.5–14.5)
WBC: 6.4 10*3/uL (ref 3.6–11.0)

## 2015-02-13 MED ORDER — FERROUS SULFATE 325 (65 FE) MG PO TABS: 325.0000 mg | ORAL_TABLET | Freq: Two times a day (BID) | ORAL | Status: AC

## 2015-02-13 MED ORDER — ENOXAPARIN SODIUM 30 MG/0.3ML ~~LOC~~ SOLN: 30.0000 mg | Freq: Two times a day (BID) | SUBCUTANEOUS | Status: AC

## 2015-02-13 MED ORDER — HYDROCODONE-ACETAMINOPHEN 5-325 MG PO TABS: 1.0000 | ORAL_TABLET | Freq: Four times a day (QID) | ORAL | Status: AC | PRN

## 2015-02-13 MED ORDER — CEPHALEXIN 500 MG PO CAPS: 500.0000 mg | ORAL_CAPSULE | Freq: Two times a day (BID) | ORAL | Status: AC

## 2015-02-13 MED ORDER — ENOXAPARIN SODIUM 30 MG/0.3ML ~~LOC~~ SOLN: 30.0000 mg | Freq: Two times a day (BID) | SUBCUTANEOUS | Status: DC

## 2015-02-13 NOTE — Care Management Important Message (Signed)
Important Message  Patient Details  Name: Penny Clements MRN: [id number] Date of Birth: [birth year]   Medicare Important Message Given:  Yes-second notification given    Darius Bump [place] 02/13/2015, 9:54 AM

## 2015-02-13 NOTE — Progress Notes (Signed)
Patient is medically stable for D/C to [facility] today. Per admissions coordinator at [facility] patient is going to room 345. RN will call report at [phone] and arrange EMS for transport. Clinical Education officer, museum (CSW) prepared D/C packet and sent D/C Summary and follow up appointment to [facility] via carefinder. Patient's son Nicki Reaper is at bedside and aware of above. Please reconsult if future social work needs arise. CSW signing off.   Blima Rich, [place] [phone]

## 2015-02-13 NOTE — Clinical Social Work Placement (Signed)
   CLINICAL SOCIAL WORK PLACEMENT  NOTE  Date:  02/13/2015  Patient Details  Name: Penny Clements MRN: [id number] Date of Birth: 04-13-1928  Clinical Social Work is seeking post-discharge placement for this patient at the [place] level of care (*CSW will initial, date and re-position this form in  chart as items are completed):  Yes   Patient/family provided with [place] Work Department's list of facilities offering this level of care within the geographic area requested by the patient (or if unable, by the patient's family).  Yes   Patient/family informed of their freedom to choose among providers that offer the needed level of care, that participate in Medicare, Medicaid or managed care program needed by the patient, have an available bed and are willing to accept the patient.  Yes   Patient/family informed of [facility]'s ownership interest in [facility] and [facility], as well as of the fact that they are under no obligation to receive care at these facilities.  PASRR submitted to EDS on 02/10/15     PASRR number received on 02/10/15     Existing PASRR number confirmed on       FL2 transmitted to all facilities in geographic area requested by pt/family on       FL2 transmitted to all facilities within larger geographic area on       Patient informed that his/her managed care company has contracts with or will negotiate with certain facilities, including the following:        Yes   Patient/family informed of bed offers received.  Patient chooses bed at  [facility] )     Physician recommends and patient chooses bed at      Patient to be transferred to  [facility] ) on 02/13/15.  Patient to be transferred to facility by  [facility] )     Patient family notified on 02/13/15 of transfer.  Name of family member notified:   (Patient's son Nicki Reaper is at bedside and aware of D/C. )     PHYSICIAN        Additional Comment:    _______________________________________________ Loralyn Freshwater, LCSW 02/13/2015, 10:38 AM

## 2015-02-13 NOTE — Progress Notes (Signed)
Physical Therapy Treatment Patient Details Name: Penny Clements MRN: [id number] DOB: [birth year] Today's Date: 02/13/2015    History of Present Illness R hip fx, ORIF    PT Comments    Pt appearing anxious with all activities requiring increased time to perform and also needed vc's to slow respiration rate (requiring intermittent rest breaks).  Pt appears very motivated to participate in PT.  Progressive mobility limited though d/t c/o R hip pain with movement/mobility.  Plan to discharge to STR today.  Follow Up Recommendations  SNF     Equipment Recommendations       Recommendations for Other Services       Precautions / Restrictions Precautions Precautions: Fall Restrictions Weight Bearing Restrictions: Yes RLE Weight Bearing: Partial weight bearing    Mobility  Bed Mobility Overal bed mobility: Needs Assistance Bed Mobility: Supine to Sit;Sit to Supine     Supine to sit: Mod assist (assist for R LE and trunk) Sit to supine: Mod assist;Max assist (assist for B LE's and trunk)   General bed mobility comments: vc's required for technique  Transfers Overall transfer level: Needs assistance Equipment used: Rolling walker (2 wheeled);None Transfers: Sit to/from [facility] Sit to Stand: Mod assist (with RW) Stand pivot transfers: Mod assist;Max assist (stand pivot on L LE with RW bed to commode; stand pivot without AD commode to bed)       General transfer comment: pt with minimum WB'ing through R LE d/t c/o pain (pt pivoting on L LE and unable to attempt to take any steps using RW d/t c/o R hip pain)  Ambulation/Gait             General Gait Details: pt unable to perform today with assist and max vc's and demo for technique   Stairs            Wheelchair Mobility    Modified Rankin (Stroke Patients Only)       Balance                                    Cognition Arousal/Alertness: Awake/alert Behavior  During Therapy: WFL for tasks assessed/performed Overall Cognitive Status: Within Functional Limits for tasks assessed                      Exercises   Performed semi-supine B LE therapeutic exercise x 15 reps:  Ankle pumps (AROM B LE's); quad sets x3 second holds (AROM B LE's); glute squeezes x3 second holds (AROM B);  SAQ's (AAROM R; AROM L); heelslides (AAROM R; AROM L), hip abd/adduction (AAROM R; AROM L).  Pt required vc's and tactile cues for correct technique with exercises.     General Comments   Nursing cleared pt for participation in physical therapy.  Pt agreeable to PT session.      Pertinent Vitals/Pain Pain Assessment: 0-10 Pain Score: 7  (minimal at rest; increases with movement/mobility) Pain Descriptors / Indicators: Aching Pain Intervention(s): Limited activity within patient's tolerance;Monitored during session;Repositioned  See flowsheet for details.    Home Living                      Prior Function            PT Goals (current goals can now be found in the care plan section) Acute Rehab PT Goals Patient Stated Goal: To walk PT Goal Formulation:  With patient Time For Goal Achievement: 02/25/15 Potential to Achieve Goals: Fair Progress towards PT goals: Progressing toward goals    Frequency  BID    PT Plan Current plan remains appropriate    Co-evaluation             End of Session Equipment Utilized During Treatment: Gait belt Activity Tolerance: Patient limited by fatigue;Patient limited by pain Patient left: in bed;with call bell/phone within reach;with bed alarm set;with SCD's reapplied     Time: 2897-9150 PT Time Calculation (min) (ACUTE ONLY): 38 min  Charges:  $Therapeutic Exercise: 8-22 mins $Therapeutic Activity: 23-37 mins                    G CodesLeitha Bleak 27-Feb-2015, 10:04 AM Leitha Bleak, [place]

## 2015-02-13 NOTE — Progress Notes (Signed)
EMS staff here- transfer patient to stretcher, discharge papers sent with EMS.

## 2015-02-13 NOTE — Progress Notes (Signed)
EMS called, report called to nurse Asencion Partridge M,RN at 1127, waiting for EMS for transport.

## 2015-02-13 NOTE — Progress Notes (Signed)
Dr.Kandiss Ihrig here to see patient - discussing plan of care.
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
## 2015-02-13 NOTE — Progress Notes (Signed)
  Subjective:  Patient discharged before I had time to see her. I spoke with Dr. Fritzi Mandes this morning who states that she was medically cleared for discharge. Patient had been doing well all weekend from an orthopedic standpoint.  Objective:   VITALS:   Filed Vitals:   02/13/15 0504 02/13/15 0723 02/13/15 0945 02/13/15 1130  BP: 115/58 125/63  140/69  Pulse: 58 99 112 108  Temp: 97.9 F (36.6 C) 97.9 F (36.6 C)  98.4 F (36.9 C)  TempSrc: Oral Oral  Oral  Resp: 18 18  18   Height:      Weight:      SpO2: 92% 98% 96% 98%    PHYSICAL EXAM:  Patient discharged before physical exam could be completed. The nurse states that she changed patient's dressing today.  LABS  Results for orders placed or performed during the hospital encounter of 02/09/15 (from the past 24 hour(s))  Glucose, capillary     Status: Abnormal   Collection Time: 02/12/15  3:13 PM  Result Value Ref Range   Glucose-Capillary 289 (H) 65 - 99 mg/dL   Comment 1 Notify RN   Glucose, capillary     Status: Abnormal   Collection Time: 02/12/15  7:45 PM  Result Value Ref Range   Glucose-Capillary 226 (H) 65 - 99 mg/dL   Comment 1 Notify RN   CBC     Status: Abnormal   Collection Time: 02/13/15  2:59 AM  Result Value Ref Range   WBC 6.4 3.6 - 11.0 K/uL   RBC 2.76 (L) 3.80 - 5.20 MIL/uL   Hemoglobin 7.4 (L) 12.0 - 16.0 g/dL   HCT 23.1 (L) 35.0 - 47.0 %   MCV 83.5 80.0 - 100.0 fL   MCH 26.8 26.0 - 34.0 pg   MCHC 32.1 32.0 - 36.0 g/dL   RDW 17.3 (H) 11.5 - 14.5 %   Platelets 201 150 - 440 K/uL  Glucose, capillary     Status: Abnormal   Collection Time: 02/13/15  7:21 AM  Result Value Ref Range   Glucose-Capillary 135 (H) 65 - 99 mg/dL   Comment 1 Notify RN   Glucose, capillary     Status: Abnormal   Collection Time: 02/13/15 11:17 AM  Result Value Ref Range   Glucose-Capillary 131 (H) 65 - 99 mg/dL   Comment 1 Notify RN     No results found.  Assessment/Plan: 3 Days Post-Op   Active Problems:   Hip fracture   Malnutrition of moderate degree  Patient will need to continue partial weightbearing on the right lower extremity for a total of 4 weeks postop. She should elevate the leg when in a chair or in bed. Ice may be applied to the right hip. She will remain on Lovenox 30 mg twice a day for 4 weeks postop. Patient should have a CBC drawn in 48 hours as she has had a slow decline in hemoglobin during her hospitalization. The results of this test should be brought to the attention of Dr. Ouida Sills at [facility]. Patient will follow up with me in 10-14 days for wound check, staple removal and x-ray.   Thornton Park , MD 02/13/2015, 12:13 PM

## 2015-02-13 NOTE — Discharge Instructions (Signed)
PT

## 2015-02-13 NOTE — Telephone Encounter (Signed)
Patient is being discharged to [facility] for Rehab, see discharge summary,

## 2015-02-13 NOTE — Telephone Encounter (Signed)
Patient will be d/c today from the hospital, please advise where to put  Patient on the schedule, due to Dr. Lupita Dawn schedule being booked. -Thanks

## 2015-02-14 LAB — CBC
HCT: 26.1 % — ABNORMAL LOW (ref 35.0–47.0)
Hemoglobin: 8.3 g/dL — ABNORMAL LOW (ref 12.0–16.0)
MCH: 26.4 pg (ref 26.0–34.0)
MCHC: 31.7 g/dL — ABNORMAL LOW (ref 32.0–36.0)
MCV: 83.4 fL (ref 80.0–100.0)
PLATELETS: 236 10*3/uL (ref 150–440)
RBC: 3.13 MIL/uL — AB (ref 3.80–5.20)
RDW: 17.3 % — ABNORMAL HIGH (ref 11.5–14.5)
WBC: 6 10*3/uL (ref 3.6–11.0)

## 2015-02-14 NOTE — Telephone Encounter (Signed)
Patient to call back and schedule appointment with PCP after rehab is completed in 2 weeks.  Will continue to follow.

## 2015-02-15 ENCOUNTER — Ambulatory Visit: Payer: Self-pay | Admitting: Surgery

## 2015-02-23 ENCOUNTER — Encounter
Admission: RE | Admit: 2015-02-23 | Discharge: 2015-02-23 | Disposition: A | Discharge status: Home / Self Care | Payer: Medicare Other | Source: Ambulatory Visit | Attending: Internal Medicine | Admitting: Internal Medicine

## 2015-03-15 LAB — VITAMIN B12: VITAMIN B 12: 416 pg/mL (ref 180–914)

## 2015-03-15 LAB — COMPREHENSIVE METABOLIC PANEL
ALK PHOS: 209 U/L — AB (ref 38–126)
ALT: 10 U/L — AB (ref 14–54)
AST: 17 U/L (ref 15–41)
Albumin: 2.8 g/dL — ABNORMAL LOW (ref 3.5–5.0)
Anion gap: 6 (ref 5–15)
BUN: 31 mg/dL — ABNORMAL HIGH (ref 6–20)
CALCIUM: 8.7 mg/dL — AB (ref 8.9–10.3)
CO2: 28 mmol/L (ref 22–32)
CREATININE: 0.85 mg/dL (ref 0.44–1.00)
Chloride: 103 mmol/L (ref 101–111)
Glucose, Bld: 291 mg/dL — ABNORMAL HIGH (ref 65–99)
Potassium: 4.2 mmol/L (ref 3.5–5.1)
Sodium: 137 mmol/L (ref 135–145)
TOTAL PROTEIN: 6.5 g/dL (ref 6.5–8.1)
Total Bilirubin: 0.6 mg/dL (ref 0.3–1.2)

## 2015-03-15 LAB — CBC WITH DIFFERENTIAL/PLATELET
Basophils Absolute: 0.1 10*3/uL (ref 0–0.1)
Basophils Relative: 1 %
EOS ABS: 0.5 10*3/uL (ref 0–0.7)
Eosinophils Relative: 7 %
HEMATOCRIT: 29.5 % — AB (ref 35.0–47.0)
HEMOGLOBIN: 9.6 g/dL — AB (ref 12.0–16.0)
LYMPHS ABS: 1.2 10*3/uL (ref 1.0–3.6)
Lymphocytes Relative: 19 %
MCH: 28.4 pg (ref 26.0–34.0)
MCHC: 32.5 g/dL (ref 32.0–36.0)
MCV: 87.5 fL (ref 80.0–100.0)
MONOS PCT: 9 %
Monocytes Absolute: 0.6 10*3/uL (ref 0.2–0.9)
NEUTROS ABS: 3.9 10*3/uL (ref 1.4–6.5)
NEUTROS PCT: 64 %
Platelets: 259 10*3/uL (ref 150–440)
RBC: 3.37 MIL/uL — ABNORMAL LOW (ref 3.80–5.20)
RDW: 18.6 % — ABNORMAL HIGH (ref 11.5–14.5)
WBC: 6.2 10*3/uL (ref 3.6–11.0)

## 2015-03-15 LAB — TSH: TSH: 2.339 u[IU]/mL (ref 0.350–4.500)

## 2015-03-15 LAB — MAGNESIUM: Magnesium: 1.9 mg/dL (ref 1.7–2.4)

## 2015-03-17 LAB — GLUCOSE, CAPILLARY
GLUCOSE-CAPILLARY: 112 mg/dL — AB (ref 65–99)
GLUCOSE-CAPILLARY: 115 mg/dL — AB (ref 65–99)

## 2015-03-18 LAB — GLUCOSE, CAPILLARY
Glucose-Capillary: 87 mg/dL (ref 65–99)
Glucose-Capillary: 353 mg/dL — ABNORMAL HIGH (ref 65–99)

## 2015-03-19 LAB — GLUCOSE, CAPILLARY
GLUCOSE-CAPILLARY: 30 mg/dL — AB (ref 65–99)
Glucose-Capillary: 21 mg/dL — CL (ref 65–99)
Glucose-Capillary: 87 mg/dL (ref 65–99)
Glucose-Capillary: 146 mg/dL — ABNORMAL HIGH (ref 65–99)

## 2015-03-19 LAB — VITAMIN D 1,25 DIHYDROXY
VITAMIN D3 1, 25 (OH): 49 pg/mL
Vitamin D 1, 25 (OH)2 Total: 50 pg/mL

## 2015-03-25 ENCOUNTER — Encounter
Admission: RE | Admit: 2015-03-25 | Discharge: 2015-03-25 | Disposition: A | Discharge status: Home / Self Care | Payer: Medicare Other | Source: Ambulatory Visit | Attending: Internal Medicine | Admitting: Internal Medicine

## 2015-03-27 ENCOUNTER — Ambulatory Visit: Payer: Self-pay | Admitting: Internal Medicine

## 2015-04-03 LAB — GLUCOSE, CAPILLARY
Glucose-Capillary: 487 mg/dL — ABNORMAL HIGH (ref 65–99)
Glucose-Capillary: 392 mg/dL — ABNORMAL HIGH (ref 65–99)
Glucose-Capillary: 452 mg/dL — ABNORMAL HIGH (ref 65–99)

## 2015-04-04 LAB — GLUCOSE, CAPILLARY
GLUCOSE-CAPILLARY: 331 mg/dL — AB (ref 65–99)
Glucose-Capillary: 411 mg/dL — ABNORMAL HIGH (ref 65–99)
Glucose-Capillary: 377 mg/dL — ABNORMAL HIGH (ref 65–99)
Glucose-Capillary: 106 mg/dL — ABNORMAL HIGH (ref 65–99)
Glucose-Capillary: 194 mg/dL — ABNORMAL HIGH (ref 65–99)

## 2015-04-05 LAB — GLUCOSE, CAPILLARY
GLUCOSE-CAPILLARY: 214 mg/dL — AB (ref 65–99)
GLUCOSE-CAPILLARY: 417 mg/dL — AB (ref 65–99)
Glucose-Capillary: 144 mg/dL — ABNORMAL HIGH (ref 65–99)
Glucose-Capillary: 295 mg/dL — ABNORMAL HIGH (ref 65–99)
Glucose-Capillary: 144 mg/dL — ABNORMAL HIGH (ref 65–99)

## 2015-04-06 LAB — GLUCOSE, CAPILLARY
GLUCOSE-CAPILLARY: 219 mg/dL — AB (ref 65–99)
GLUCOSE-CAPILLARY: 62 mg/dL — AB (ref 65–99)
GLUCOSE-CAPILLARY: 74 mg/dL (ref 65–99)
Glucose-Capillary: 210 mg/dL — ABNORMAL HIGH (ref 65–99)

## 2015-04-07 LAB — GLUCOSE, CAPILLARY
GLUCOSE-CAPILLARY: 165 mg/dL — AB (ref 65–99)
GLUCOSE-CAPILLARY: 215 mg/dL — AB (ref 65–99)
GLUCOSE-CAPILLARY: 350 mg/dL — AB (ref 65–99)
GLUCOSE-CAPILLARY: 258 mg/dL — AB (ref 65–99)
Glucose-Capillary: 34 mg/dL — CL (ref 65–99)
Glucose-Capillary: 269 mg/dL — ABNORMAL HIGH (ref 65–99)

## 2015-04-08 LAB — GLUCOSE, CAPILLARY
GLUCOSE-CAPILLARY: 211 mg/dL — AB (ref 65–99)
GLUCOSE-CAPILLARY: 245 mg/dL — AB (ref 65–99)
Glucose-Capillary: 437 mg/dL — ABNORMAL HIGH (ref 65–99)

## 2015-04-09 LAB — GLUCOSE, CAPILLARY
GLUCOSE-CAPILLARY: 393 mg/dL — AB (ref 65–99)
GLUCOSE-CAPILLARY: 85 mg/dL (ref 65–99)
GLUCOSE-CAPILLARY: 215 mg/dL — AB (ref 65–99)
Glucose-Capillary: 454 mg/dL — ABNORMAL HIGH (ref 65–99)
Glucose-Capillary: 169 mg/dL — ABNORMAL HIGH (ref 65–99)

## 2015-04-10 LAB — GLUCOSE, CAPILLARY
GLUCOSE-CAPILLARY: 259 mg/dL — AB (ref 65–99)
Glucose-Capillary: 382 mg/dL — ABNORMAL HIGH (ref 65–99)
Glucose-Capillary: 515 mg/dL — ABNORMAL HIGH (ref 65–99)

## 2015-04-11 LAB — GLUCOSE, CAPILLARY
GLUCOSE-CAPILLARY: 125 mg/dL — AB (ref 65–99)
GLUCOSE-CAPILLARY: 185 mg/dL — AB (ref 65–99)
Glucose-Capillary: 221 mg/dL — ABNORMAL HIGH (ref 65–99)
Glucose-Capillary: 247 mg/dL — ABNORMAL HIGH (ref 65–99)
Glucose-Capillary: 249 mg/dL — ABNORMAL HIGH (ref 65–99)

## 2015-04-12 LAB — GLUCOSE, CAPILLARY
GLUCOSE-CAPILLARY: 142 mg/dL — AB (ref 65–99)
GLUCOSE-CAPILLARY: 161 mg/dL — AB (ref 65–99)
GLUCOSE-CAPILLARY: 144 mg/dL — AB (ref 65–99)
Glucose-Capillary: 370 mg/dL — ABNORMAL HIGH (ref 65–99)
Glucose-Capillary: 220 mg/dL — ABNORMAL HIGH (ref 65–99)
Glucose-Capillary: 215 mg/dL — ABNORMAL HIGH (ref 65–99)

## 2015-04-13 LAB — GLUCOSE, CAPILLARY
GLUCOSE-CAPILLARY: 346 mg/dL — AB (ref 65–99)
GLUCOSE-CAPILLARY: 335 mg/dL — AB (ref 65–99)
Glucose-Capillary: 30 mg/dL — CL (ref 65–99)
Glucose-Capillary: 91 mg/dL (ref 65–99)
Glucose-Capillary: 240 mg/dL — ABNORMAL HIGH (ref 65–99)
Glucose-Capillary: 363 mg/dL — ABNORMAL HIGH (ref 65–99)
Glucose-Capillary: 371 mg/dL — ABNORMAL HIGH (ref 65–99)

## 2015-04-14 LAB — GLUCOSE, CAPILLARY
GLUCOSE-CAPILLARY: 138 mg/dL — AB (ref 65–99)
GLUCOSE-CAPILLARY: 439 mg/dL — AB (ref 65–99)

## 2015-04-15 LAB — GLUCOSE, CAPILLARY
GLUCOSE-CAPILLARY: 241 mg/dL — AB (ref 65–99)
GLUCOSE-CAPILLARY: 290 mg/dL — AB (ref 65–99)
Glucose-Capillary: 208 mg/dL — ABNORMAL HIGH (ref 65–99)
Glucose-Capillary: 103 mg/dL — ABNORMAL HIGH (ref 65–99)
Glucose-Capillary: 195 mg/dL — ABNORMAL HIGH (ref 65–99)

## 2015-04-16 LAB — GLUCOSE, CAPILLARY
GLUCOSE-CAPILLARY: 267 mg/dL — AB (ref 65–99)
GLUCOSE-CAPILLARY: 140 mg/dL — AB (ref 65–99)
Glucose-Capillary: 80 mg/dL (ref 65–99)

## 2015-04-17 LAB — GLUCOSE, CAPILLARY
GLUCOSE-CAPILLARY: 84 mg/dL (ref 65–99)
GLUCOSE-CAPILLARY: 149 mg/dL — AB (ref 65–99)
GLUCOSE-CAPILLARY: 354 mg/dL — AB (ref 65–99)
Glucose-Capillary: 61 mg/dL — ABNORMAL LOW (ref 65–99)
Glucose-Capillary: 304 mg/dL — ABNORMAL HIGH (ref 65–99)
Glucose-Capillary: 291 mg/dL — ABNORMAL HIGH (ref 65–99)

## 2015-04-18 LAB — GLUCOSE, CAPILLARY
Glucose-Capillary: 265 mg/dL — ABNORMAL HIGH (ref 65–99)
Glucose-Capillary: 306 mg/dL — ABNORMAL HIGH (ref 65–99)
Glucose-Capillary: 122 mg/dL — ABNORMAL HIGH (ref 65–99)
Glucose-Capillary: 309 mg/dL — ABNORMAL HIGH (ref 65–99)

## 2015-04-19 LAB — GLUCOSE, CAPILLARY
GLUCOSE-CAPILLARY: 212 mg/dL — AB (ref 65–99)
Glucose-Capillary: 197 mg/dL — ABNORMAL HIGH (ref 65–99)
Glucose-Capillary: 89 mg/dL (ref 65–99)

## 2015-04-20 LAB — GLUCOSE, CAPILLARY
GLUCOSE-CAPILLARY: 216 mg/dL — AB (ref 65–99)
GLUCOSE-CAPILLARY: 164 mg/dL — AB (ref 65–99)
Glucose-Capillary: 261 mg/dL — ABNORMAL HIGH (ref 65–99)
Glucose-Capillary: 220 mg/dL — ABNORMAL HIGH (ref 65–99)

## 2015-04-21 LAB — GLUCOSE, CAPILLARY
GLUCOSE-CAPILLARY: 207 mg/dL — AB (ref 65–99)
GLUCOSE-CAPILLARY: 231 mg/dL — AB (ref 65–99)
GLUCOSE-CAPILLARY: 26 mg/dL — AB (ref 65–99)
Glucose-Capillary: 318 mg/dL — ABNORMAL HIGH (ref 65–99)
Glucose-Capillary: 193 mg/dL — ABNORMAL HIGH (ref 65–99)
Glucose-Capillary: 50 mg/dL — ABNORMAL LOW (ref 65–99)
Glucose-Capillary: 156 mg/dL — ABNORMAL HIGH (ref 65–99)

## 2015-04-22 LAB — GLUCOSE, CAPILLARY
GLUCOSE-CAPILLARY: 264 mg/dL — AB (ref 65–99)
GLUCOSE-CAPILLARY: 388 mg/dL — AB (ref 65–99)
GLUCOSE-CAPILLARY: 330 mg/dL — AB (ref 65–99)
GLUCOSE-CAPILLARY: 409 mg/dL — AB (ref 65–99)

## 2015-04-23 LAB — GLUCOSE, CAPILLARY
GLUCOSE-CAPILLARY: 336 mg/dL — AB (ref 65–99)
GLUCOSE-CAPILLARY: 290 mg/dL — AB (ref 65–99)
Glucose-Capillary: 335 mg/dL — ABNORMAL HIGH (ref 65–99)
Glucose-Capillary: 270 mg/dL — ABNORMAL HIGH (ref 65–99)

## 2015-04-24 LAB — GLUCOSE, CAPILLARY
GLUCOSE-CAPILLARY: 276 mg/dL — AB (ref 65–99)
GLUCOSE-CAPILLARY: 298 mg/dL — AB (ref 65–99)
Glucose-Capillary: 290 mg/dL — ABNORMAL HIGH (ref 65–99)

## 2015-04-25 ENCOUNTER — Encounter
Admission: RE | Admit: 2015-04-25 | Discharge: 2015-04-25 | Disposition: A | Discharge status: Home / Self Care | Payer: Medicare Other | Source: Ambulatory Visit | Attending: Internal Medicine | Admitting: Internal Medicine

## 2015-04-25 LAB — GLUCOSE, CAPILLARY
GLUCOSE-CAPILLARY: 250 mg/dL — AB (ref 65–99)
GLUCOSE-CAPILLARY: 155 mg/dL — AB (ref 65–99)

## 2015-04-26 LAB — GLUCOSE, CAPILLARY
GLUCOSE-CAPILLARY: 76 mg/dL (ref 65–99)
GLUCOSE-CAPILLARY: 63 mg/dL — AB (ref 65–99)
GLUCOSE-CAPILLARY: 207 mg/dL — AB (ref 65–99)
Glucose-Capillary: 44 mg/dL — CL (ref 65–99)
Glucose-Capillary: 30 mg/dL — CL (ref 65–99)
Glucose-Capillary: 27 mg/dL — CL (ref 65–99)
Glucose-Capillary: 276 mg/dL — ABNORMAL HIGH (ref 65–99)

## 2015-04-27 LAB — GLUCOSE, CAPILLARY
GLUCOSE-CAPILLARY: 208 mg/dL — AB (ref 65–99)
GLUCOSE-CAPILLARY: 251 mg/dL — AB (ref 65–99)
GLUCOSE-CAPILLARY: 201 mg/dL — AB (ref 65–99)
Glucose-Capillary: 234 mg/dL — ABNORMAL HIGH (ref 65–99)

## 2015-04-28 LAB — GLUCOSE, CAPILLARY
GLUCOSE-CAPILLARY: 134 mg/dL — AB (ref 65–99)
GLUCOSE-CAPILLARY: 489 mg/dL — AB (ref 65–99)
GLUCOSE-CAPILLARY: 363 mg/dL — AB (ref 65–99)
Glucose-Capillary: 36 mg/dL — CL (ref 65–99)

## 2015-04-29 LAB — GLUCOSE, CAPILLARY
GLUCOSE-CAPILLARY: 393 mg/dL — AB (ref 65–99)
GLUCOSE-CAPILLARY: 107 mg/dL — AB (ref 65–99)
GLUCOSE-CAPILLARY: 228 mg/dL — AB (ref 65–99)
GLUCOSE-CAPILLARY: 152 mg/dL — AB (ref 65–99)

## 2015-04-30 LAB — GLUCOSE, CAPILLARY
GLUCOSE-CAPILLARY: 181 mg/dL — AB (ref 65–99)
GLUCOSE-CAPILLARY: 387 mg/dL — AB (ref 65–99)
Glucose-Capillary: 256 mg/dL — ABNORMAL HIGH (ref 65–99)

## 2015-05-01 LAB — GLUCOSE, CAPILLARY: Glucose-Capillary: 210 mg/dL — ABNORMAL HIGH (ref 65–99)

## 2015-05-03 ENCOUNTER — Ambulatory Visit: Payer: Medicare Other | Attending: Internal Medicine | Admitting: Physical Therapy

## 2015-05-03 ENCOUNTER — Encounter: Payer: Self-pay | Admitting: Physical Therapy

## 2015-05-03 DIAGNOSIS — R262 Difficulty in walking, not elsewhere classified: Secondary | ICD-10-CM | POA: Insufficient documentation

## 2015-05-03 DIAGNOSIS — R531 Weakness: Secondary | ICD-10-CM

## 2015-05-03 LAB — GLUCOSE, CAPILLARY: Glucose-Capillary: 486 mg/dL — ABNORMAL HIGH (ref 65–99)
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
## 2015-05-04 LAB — GLUCOSE, CAPILLARY: GLUCOSE-CAPILLARY: 492 mg/dL — AB (ref 65–99)

## 2015-05-05 LAB — GLUCOSE, CAPILLARY: GLUCOSE-CAPILLARY: 583 mg/dL — AB (ref 65–99)

## 2015-05-06 LAB — GLUCOSE, CAPILLARY
GLUCOSE-CAPILLARY: 221 mg/dL — AB (ref 65–99)
Glucose-Capillary: 366 mg/dL — ABNORMAL HIGH (ref 65–99)

## 2015-05-09 ENCOUNTER — Ambulatory Visit: Payer: Medicare Other

## 2015-05-09 DIAGNOSIS — R262 Difficulty in walking, not elsewhere classified: Secondary | ICD-10-CM

## 2015-05-09 DIAGNOSIS — R531 Weakness: Secondary | ICD-10-CM

## 2015-05-09 NOTE — Patient Instructions (Signed)
[email or web address] Bridge 2x10 SLR 2x10 Hip abduction/adduction in supine 2x10 each LE
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
## 2015-05-11 ENCOUNTER — Ambulatory Visit: Payer: Medicare Other

## 2015-05-11 DIAGNOSIS — R262 Difficulty in walking, not elsewhere classified: Secondary | ICD-10-CM

## 2015-05-11 DIAGNOSIS — R531 Weakness: Secondary | ICD-10-CM
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
## 2015-05-16 ENCOUNTER — Ambulatory Visit: Payer: Medicare Other

## 2015-05-16 DIAGNOSIS — R531 Weakness: Secondary | ICD-10-CM

## 2015-05-16 DIAGNOSIS — R262 Difficulty in walking, not elsewhere classified: Secondary | ICD-10-CM | POA: Diagnosis not present
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
## 2015-05-16 NOTE — Patient Instructions (Signed)
Sitting LAQ 2x10 Standing hip abd/march holding walker x 10 Sit to stand using RW x 3 PT wrote letter to pts assisted living stating pt is safe and recommended to perform HEP and walk with supervision as follows: 05/16/15 To whom it may concern,  It would be beneficial for Penny Clements to be more active at home with supervision. She would benefit from more frequent supervised walking with her walker and supervision performing her prescribed home exercise program from physical therapy. She should be independent with her bed exercises. She is safe to participate in sit to stand with her walker and supervision/stand by guarding.  Any assistance for her to me more active at home would be very beneficial for her mobility and independence. Thank you very much.  Please call with any questions or concerns.  Sincerely,    Alease Medina PT, DPT [facility] outpatient physical therapy [phone]

## 2015-05-22 ENCOUNTER — Emergency Department: Payer: Medicare Other

## 2015-05-22 ENCOUNTER — Emergency Department
Admission: EM | Admit: 2015-05-22 | Discharge: 2015-05-22 | Disposition: A | Discharge status: Home / Self Care | Payer: Medicare Other | Attending: Emergency Medicine | Admitting: Emergency Medicine

## 2015-05-22 DIAGNOSIS — R4182 Altered mental status, unspecified: Secondary | ICD-10-CM | POA: Diagnosis not present

## 2015-05-22 DIAGNOSIS — E872 Acidosis, unspecified: Secondary | ICD-10-CM

## 2015-05-22 DIAGNOSIS — E1151 Type 2 diabetes mellitus with diabetic peripheral angiopathy without gangrene: Secondary | ICD-10-CM | POA: Insufficient documentation

## 2015-05-22 DIAGNOSIS — E1165 Type 2 diabetes mellitus with hyperglycemia: Secondary | ICD-10-CM | POA: Diagnosis not present

## 2015-05-22 DIAGNOSIS — Z87891 Personal history of nicotine dependence: Secondary | ICD-10-CM | POA: Diagnosis not present

## 2015-05-22 DIAGNOSIS — E860 Dehydration: Secondary | ICD-10-CM | POA: Diagnosis not present

## 2015-05-22 DIAGNOSIS — Z515 Encounter for palliative care: Secondary | ICD-10-CM

## 2015-05-22 DIAGNOSIS — R739 Hyperglycemia, unspecified: Secondary | ICD-10-CM

## 2015-05-22 LAB — COMPREHENSIVE METABOLIC PANEL
ALT: 11 U/L — AB (ref 14–54)
AST: 19 U/L (ref 15–41)
Albumin: 2.8 g/dL — ABNORMAL LOW (ref 3.5–5.0)
Alkaline Phosphatase: 134 U/L — ABNORMAL HIGH (ref 38–126)
BUN: 69 mg/dL — AB (ref 6–20)
CHLORIDE: 88 mmol/L — AB (ref 101–111)
CO2: 11 mmol/L — AB (ref 22–32)
CREATININE: 2.51 mg/dL — AB (ref 0.44–1.00)
Calcium: 8.9 mg/dL (ref 8.9–10.3)
GFR calc Af Amer: 19 mL/min — ABNORMAL LOW (ref >60)
GFR, EST NON AFRICAN AMERICAN: 16 mL/min — AB (ref >60)
Glucose, Bld: 951 mg/dL (ref 65–99)
Total Bilirubin: 1.6 mg/dL — ABNORMAL HIGH (ref 0.3–1.2)
Total Protein: 7.3 g/dL (ref 6.5–8.1)

## 2015-05-22 LAB — GLUCOSE, CAPILLARY: Glucose-Capillary: >600 mg/dL (ref 65–99)

## 2015-05-22 LAB — CBC WITH DIFFERENTIAL/PLATELET
Basophils Absolute: 0.1 10*3/uL (ref 0–0.1)
Basophils Relative: 0 %
Eosinophils Absolute: 0 10*3/uL (ref 0–0.7)
HCT: 39.6 % (ref 35.0–47.0)
Hemoglobin: 10.8 g/dL — ABNORMAL LOW (ref 12.0–16.0)
LYMPHS ABS: 0.9 10*3/uL — AB (ref 1.0–3.6)
MCH: 26.2 pg (ref 26.0–34.0)
MCHC: 27.2 g/dL — ABNORMAL LOW (ref 32.0–36.0)
MCV: 96.1 fL (ref 80.0–100.0)
MONO ABS: 1.5 10*3/uL — AB (ref 0.2–0.9)
Neutro Abs: 29.8 10*3/uL — ABNORMAL HIGH (ref 1.4–6.5)
Neutrophils Relative %: 92 %
PLATELETS: 171 10*3/uL (ref 150–440)
RBC: 4.12 MIL/uL (ref 3.80–5.20)
RDW: 20.2 % — AB (ref 11.5–14.5)
WBC: 32.3 10*3/uL — ABNORMAL HIGH (ref 3.6–11.0)

## 2015-05-22 LAB — BLOOD GAS, VENOUS
Acid-base deficit: 20.4 mmol/L — ABNORMAL HIGH (ref 0.0–2.0)
Bicarbonate: 9.1 mEq/L — ABNORMAL LOW (ref 21.0–28.0)
FIO2: 0.21
Patient temperature: 37
pCO2, Ven: 33 mmHg — ABNORMAL LOW (ref 44.0–60.0)
pH, Ven: 7.05 — CL (ref 7.320–7.430)

## 2015-05-22 LAB — TROPONIN I: TROPONIN I: 0.03 ng/mL (ref <0.031)

## 2015-05-22 LAB — LACTIC ACID, PLASMA: LACTIC ACID, VENOUS: 3.3 mmol/L — AB (ref 0.5–2.0)

## 2015-05-22 MED ORDER — SODIUM CHLORIDE 0.9 % IV BOLUS (SEPSIS): 1000.0000 mL | Freq: Once | INTRAVENOUS | Status: DC

## 2015-05-22 MED ORDER — SODIUM CHLORIDE 0.9 % IV BOLUS (SEPSIS): 500.0000 mL | INTRAVENOUS | Status: DC

## 2015-05-22 MED ORDER — PIPERACILLIN-TAZOBACTAM 3.375 G IVPB 30 MIN: 3.3750 g | Freq: Once | INTRAVENOUS | Status: DC

## 2015-05-22 MED ORDER — VANCOMYCIN HCL IN DEXTROSE 1-5 GM/200ML-% IV SOLN: 1000.0000 mg | Freq: Once | INTRAVENOUS | Status: DC

## 2015-05-22 NOTE — ED Notes (Signed)
Son giving orders for comfort care only: Revonda Standard.
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
## 2015-05-22 NOTE — ED Notes (Signed)
IV fluids stopped.

## 2015-05-22 NOTE — ED Notes (Signed)
Pts son at bedside, stating to Dr Jimmye Norman that he wants comfort care only for his mother, states her wishes were to not have anything done to prolong her life, including fluids, or antibiotics.

## 2015-05-22 NOTE — Progress Notes (Signed)
[place] referral received from [facility]. Ms. Depaz is an 81 year old woman brought to the [facility] ED for assessment of non responsiveness.  She was found to have an elevated blood glucose > 600 upon arrival to the ED, she also has an elevated WBC of 32. She was started on IVF. Her son arrived and requested comfort care. DNR in place. PMH includes: DM II, Right Hip fx, depression, osteoporosis, urinary incontinence,Lung Ca. Writer met in the patient's room with her son Sonia Side, he stated he was the HCPOA. Education initiated regarding hospice services, philosophy and team approach to care with good understanding voiced. Writer outlined services at [facility] as well as at the hospice home, Sonia Side requested that patient be transferred to the [facility] for comfort/EOL care. Consents signed, information faxed to referral, report called to the Hospice home. Hospital care team made aware of plans for discharge via EMS to the hospice home. EMS notified for pick up. Signed portable DNR in place on patient's chart. Thank you for the opportunity to be involved in the care of this patient.  Flo Shanks RN, BSN, [place] [facility] [place], [facility] [phone]

## 2015-05-22 NOTE — ED Notes (Signed)
Family at bedside. 

## 2015-05-22 NOTE — ED Notes (Signed)
Hospice at bedside with pt and family

## 2015-05-23 ENCOUNTER — Ambulatory Visit: Payer: Medicare Other

## 2015-05-23 NOTE — Therapy (Signed)
[place] MAIN [facility] SERVICES [address] [place], [place], [address] Phone: [phone]   Fax:  [phone]  May 23, 2015   @[email or web address] @  Physical Therapy Discharge Summary  Patient: ESHAL PROPPS  MRN: [id number]  Date of Birth: [birth year]   Diagnosis: No diagnosis found. Referring Provider: Dr. Derrel Nip  The above patient had been seen in Physical Therapy 4 times of 9 treatments scheduled with 0 no shows and *0cancellations.  The treatment consisted of therex, balance and gait training  The patient is: Unchanged  Subjective: NA  Discharge Findings: pt was admitted to hospital 11/28. Has been placed on hospice care  Functional Status at Discharge: pt has been placed on hospice care and wont be returning to PT. She will be DC at this time      PT Long Term Goals - 05/03/15 1418    PT LONG TERM GOAL #1   Title Patient will be independent in home exercise program to improve strength/mobility for better functional independence with ADLs   PT LONG TERM GOAL #2   Title Patient (> 30 years old) will complete five times sit to stand test in < 15 seconds indicating an increased LE strength and improved balance.   Time 12   Period Weeks   Status New   PT LONG TERM GOAL #3   Title Patient will increase six minute walk test distance to >1000 for progression to community ambulator and improve gait ability   Time 12   Period Weeks   PT LONG TERM GOAL #4   Title Patient will reduce timed up and go to <11 seconds to reduce fall risk and demonstrate improved transfer/gait ability.   Time 12   PT LONG TERM GOAL #5   Title Patient will increase 10 meter walk test to >1.42ms as to improve gait speed for better community ambulation and to reduce fall risk   Time 12   Period Weeks       No Goals Met    Sincerely, ACaryl PinaC. Rodolph Hagemann, PT, DPT ##[id number]  TAlease Medina PT   CC @CCLISTRESTNAME @  [place] [address]Phone: [phone]  Fax:  [phone] Patient: MDORCUS RIGA MRN: [id number] Date of Birth: 202-16-1929

## 2015-05-25 ENCOUNTER — Telehealth: Payer: Self-pay | Admitting: Internal Medicine

## 2015-05-25 ENCOUNTER — Ambulatory Visit: Payer: Medicare Other

## 2015-05-25 NOTE — Telephone Encounter (Signed)
Terry from Hospice called at 2:55pm, 06-15-15. Micah Noel passed away.  kp

## 2015-05-25 NOTE — Telephone Encounter (Signed)
Patient passed away

## 2015-05-26 NOTE — Telephone Encounter (Signed)
I would like to send to sympathy card to her son

## 2015-05-27 LAB — CULTURE, BLOOD (ROUTINE X 2): Culture: NO GROWTH

## 2015-05-29 NOTE — Telephone Encounter (Signed)
In red folder. 

## 2015-05-30 ENCOUNTER — Ambulatory Visit: Payer: Medicare Other

## 2015-06-08 ENCOUNTER — Ambulatory Visit: Payer: Medicare Other

## 2015-06-13 ENCOUNTER — Ambulatory Visit: Payer: Medicare Other

## 2015-06-25 DEATH — deceased

## 2015-07-18 ENCOUNTER — Ambulatory Visit: Payer: Self-pay

## 2015-07-20 ENCOUNTER — Ambulatory Visit: Payer: Self-pay

## 2016-10-16 IMAGING — CR DG HIP (WITH OR WITHOUT PELVIS) 2-3V*R*
3 series · 3 of 3 positions shown · non-contrast
Comparison: None.

CLINICAL DATA: Patient status post fall. Right hip pain. Unable to
move right leg.

EXAM:
DG HIP (WITH OR WITHOUT PELVIS) 2-3V RIGHT

[hip ap]
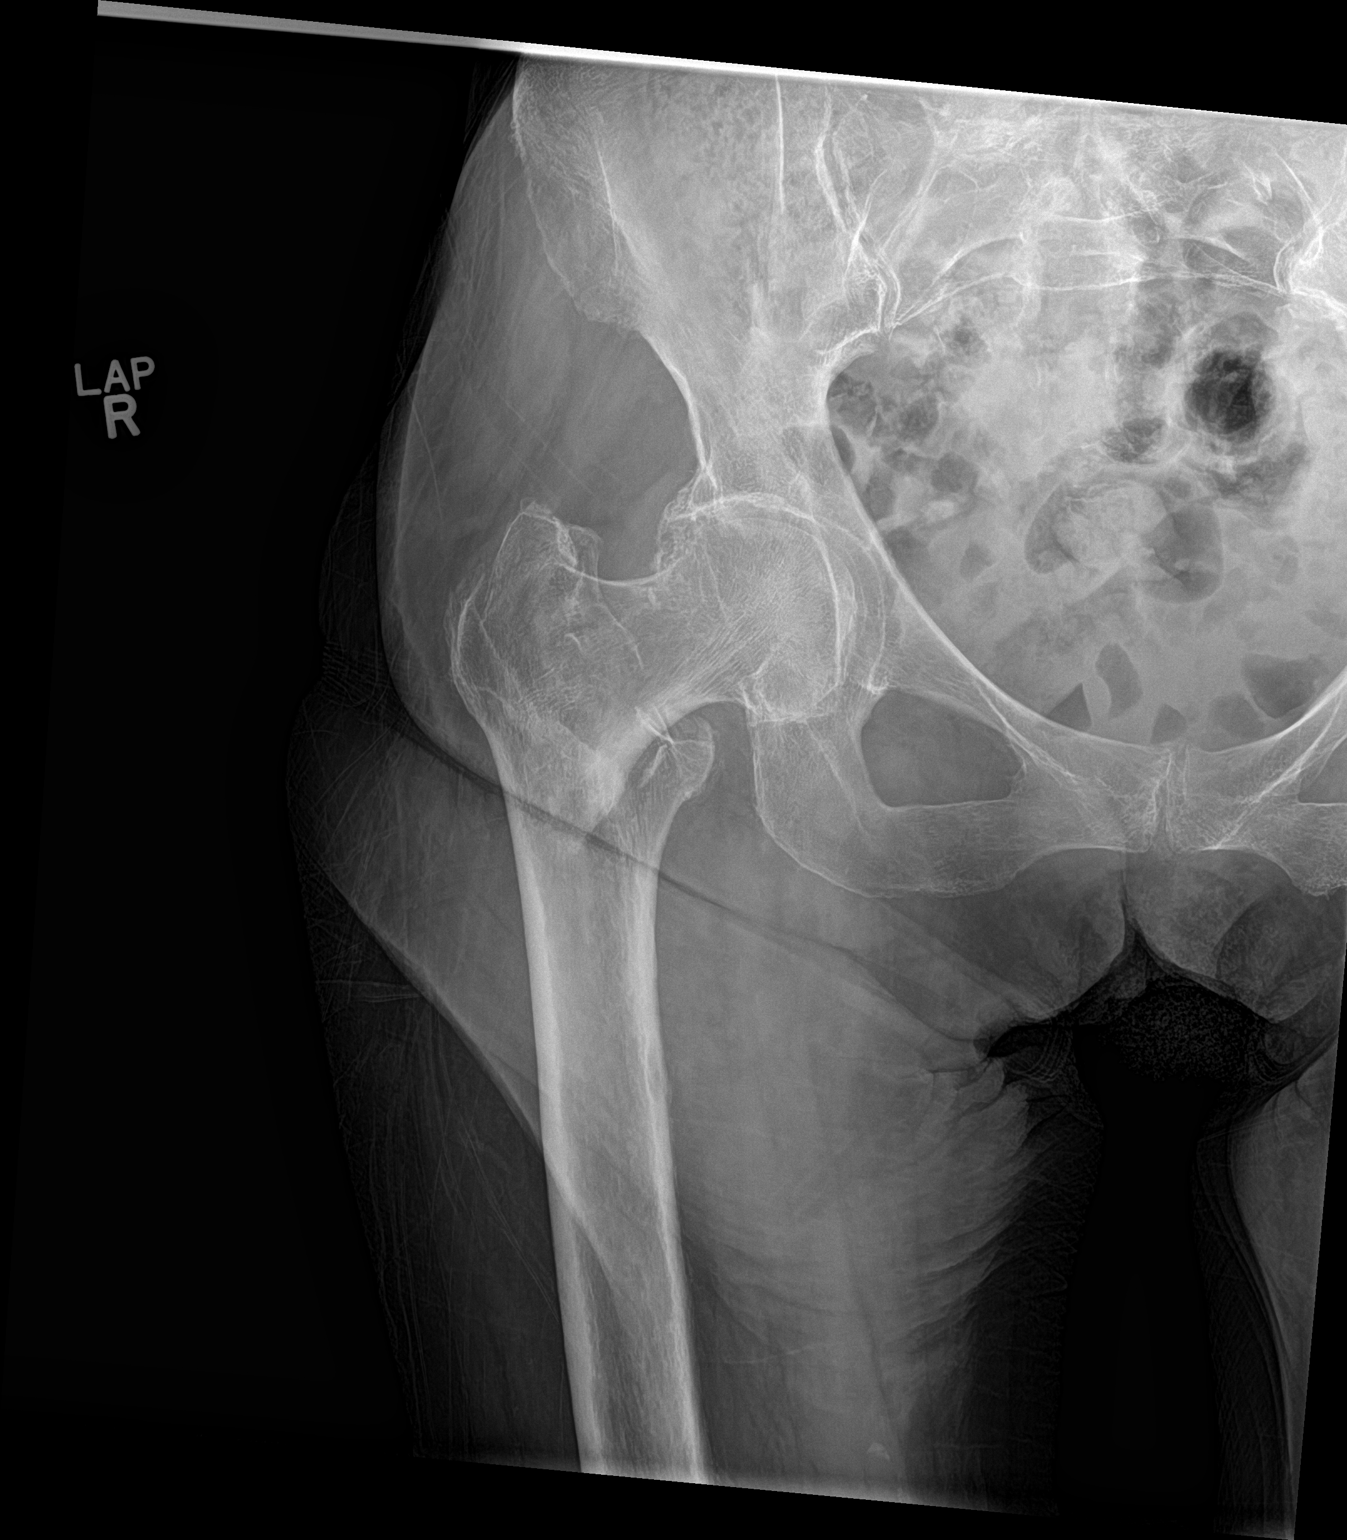

[hip lat]
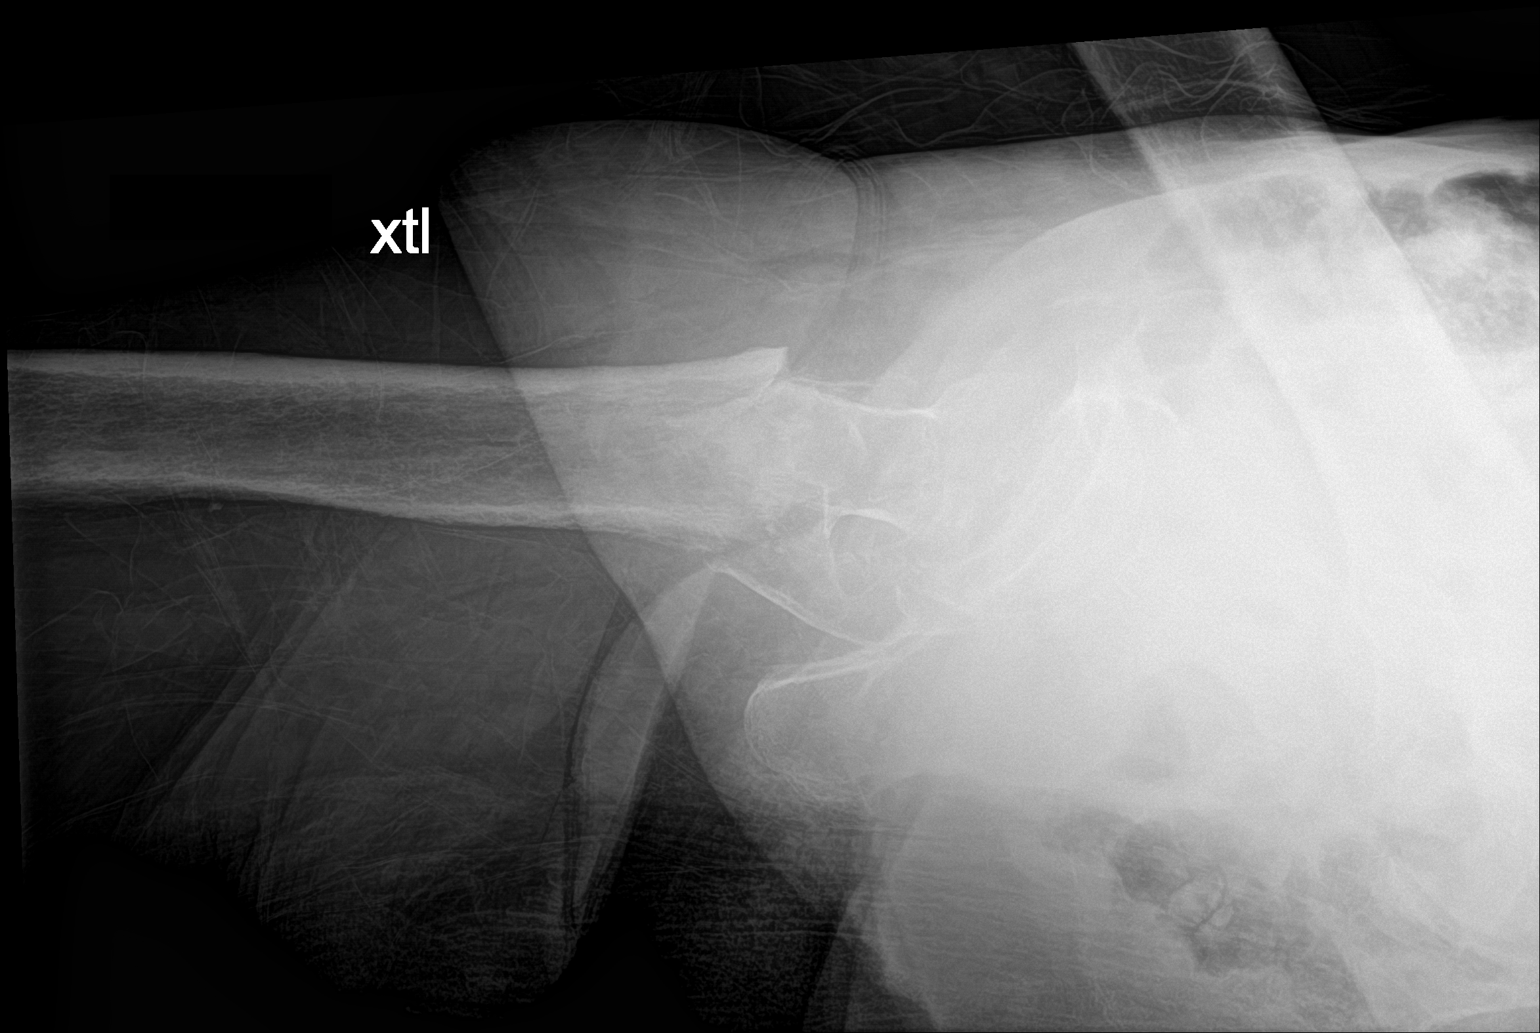

[pelvis ap]
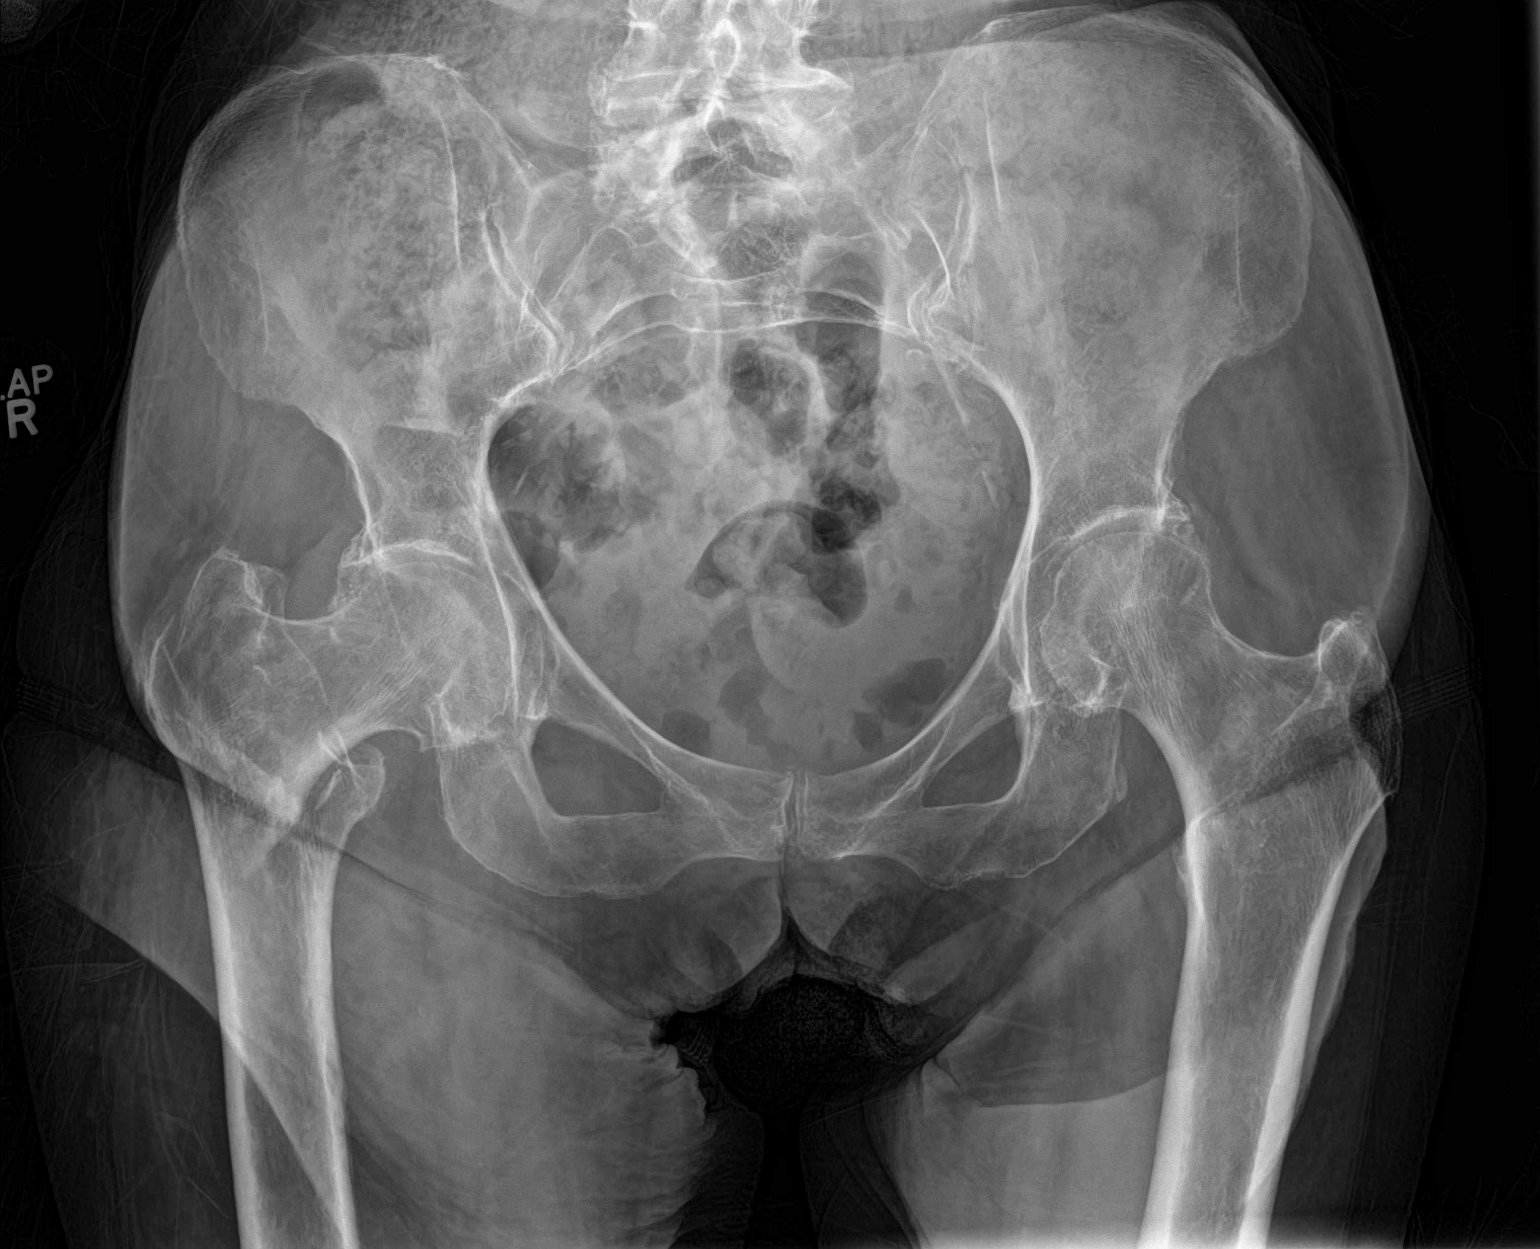

[3 of 3 positions shown; findings below may reference images not displayed]

FINDINGS: Findings compatible with a displaced intertrochanteric right femur
fracture. Lower lumbar spine degenerative changes. Bilateral hip
joint degenerative changes.
IMPRESSION: Displaced right intertrochanteric femur fracture.

## 2016-10-17 IMAGING — CR DG HIP (WITH OR WITHOUT PELVIS) 1V PORT*R*
1 series · 2 of 2 positions shown · non-contrast
Comparison: 02/09/2015

CLINICAL DATA: Postop fracture fixation.

EXAM:
DG HIP (WITH OR WITHOUT PELVIS) 1V PORT RIGHT

[Series 1: ap · 0.17mm/px · 2 of 2 slices shown]
[im 1/2]
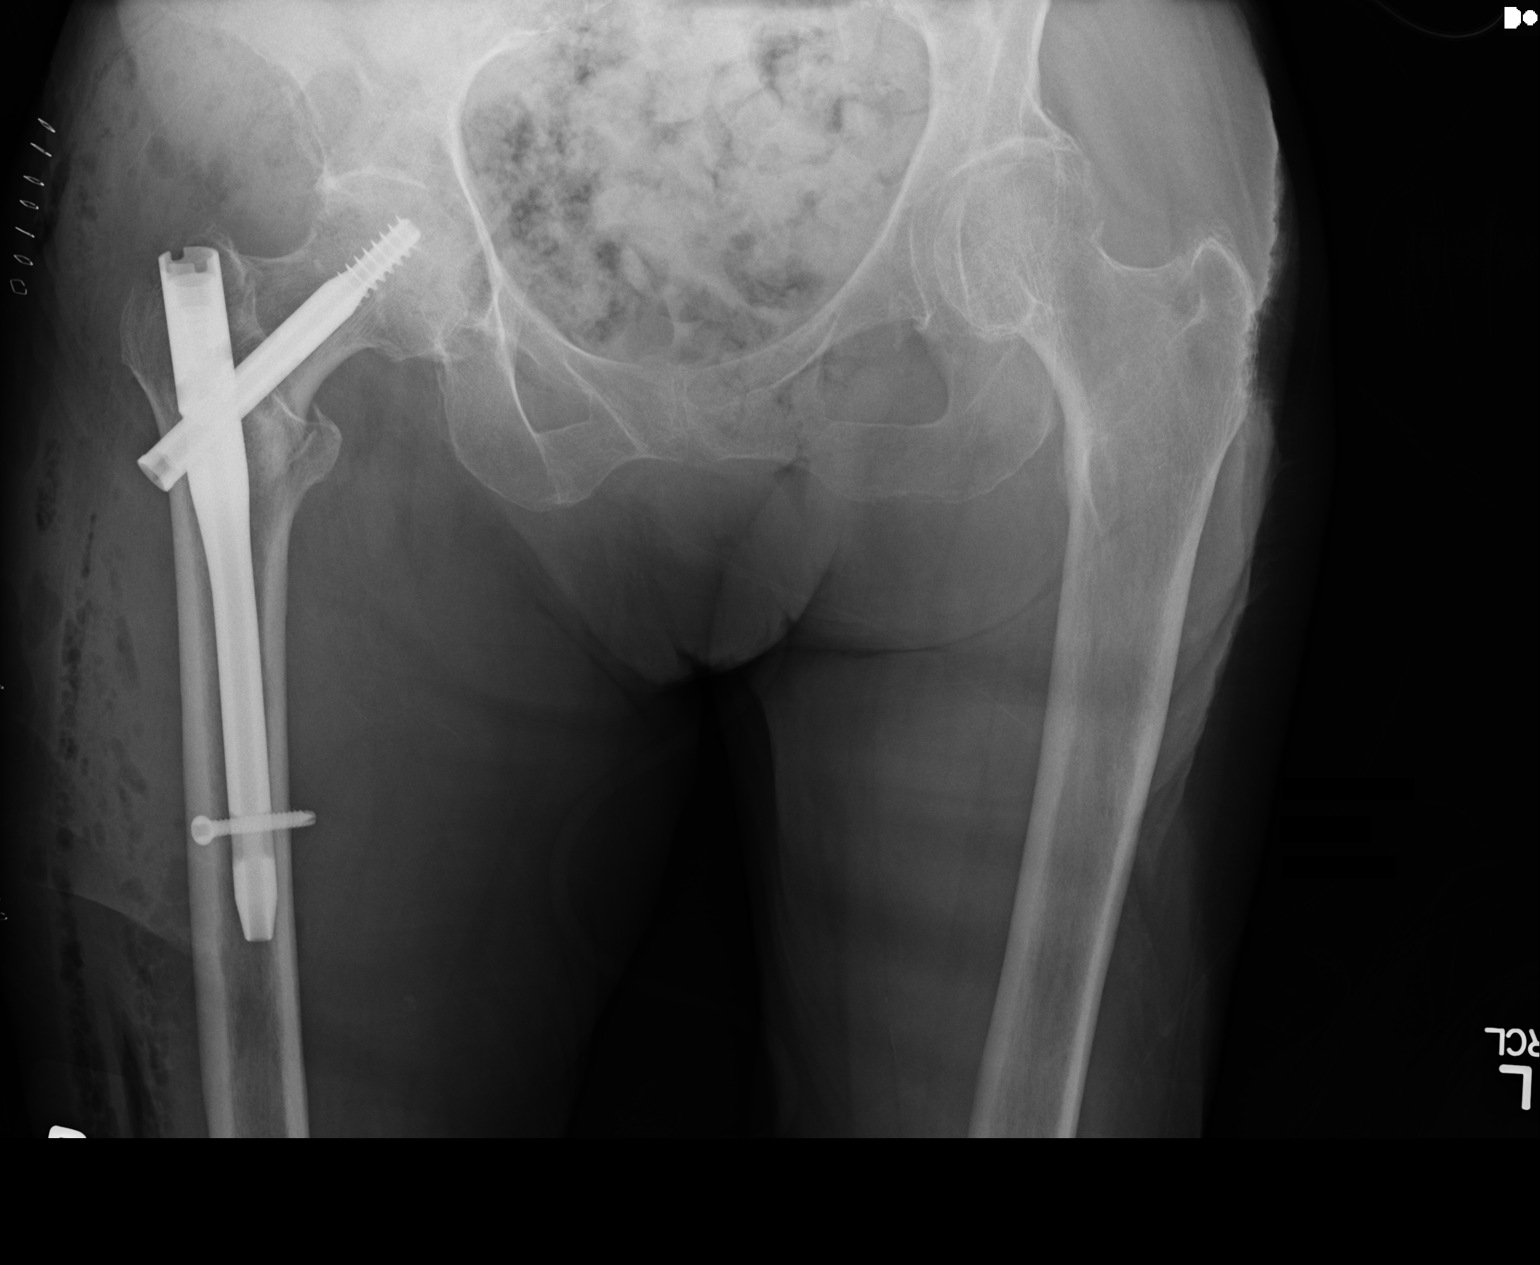
[im 2/2]
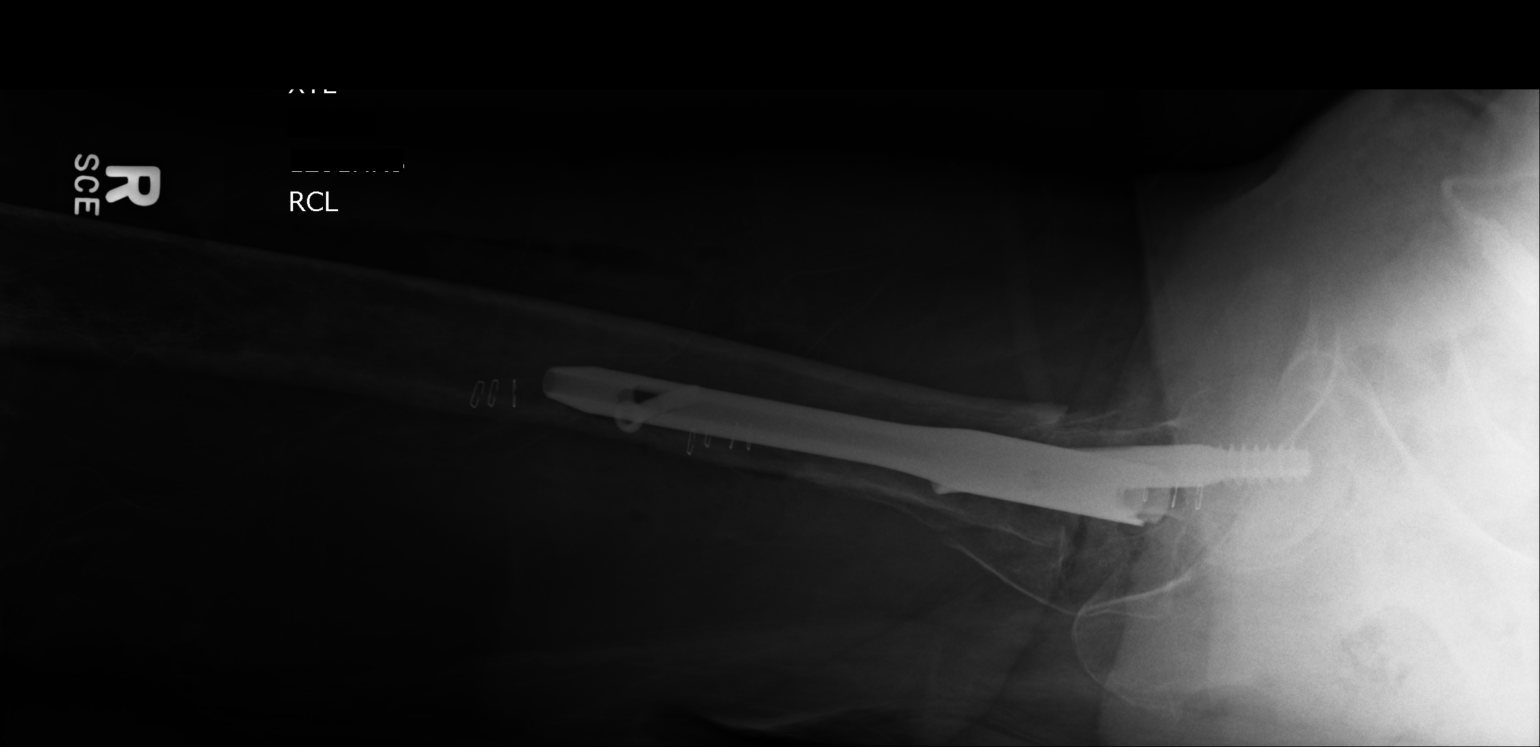

[2 of 2 positions shown; findings below may reference images not displayed]

FINDINGS: There is an intra medullary gamma nail with a proximal dynamic screw
and distal interlocking screw transfixing the intertrochanteric
fracture. Near anatomic reduction. No complicating features. Stable
advanced degenerative changes involving the left hip. No definite
pelvic fractures.
IMPRESSION: Closed reduction and internal fixation of an intertrochanteric
fracture. Good position/alignment without complicating features.

## 2016-10-17 IMAGING — CR DG HIP (WITH PELVIS) OPERATIVE*R*
4 series · 4 of 4 positions shown · non-contrast
Comparison: Radiographs dated 02/09/2015

CLINICAL DATA: Intertrochanteric fracture of the proximal right
femur.

EXAM:
OPERATIVE RIGHT HIP (WITH PELVIS IF PERFORMED) 2 VIEWS
TECHNIQUE: Fluoroscopic spot image(s) were submitted for interpretation
post-operatively.
FLUOROSCOPY TIME:  43 SECONDS

[cont. (1 of 4)]
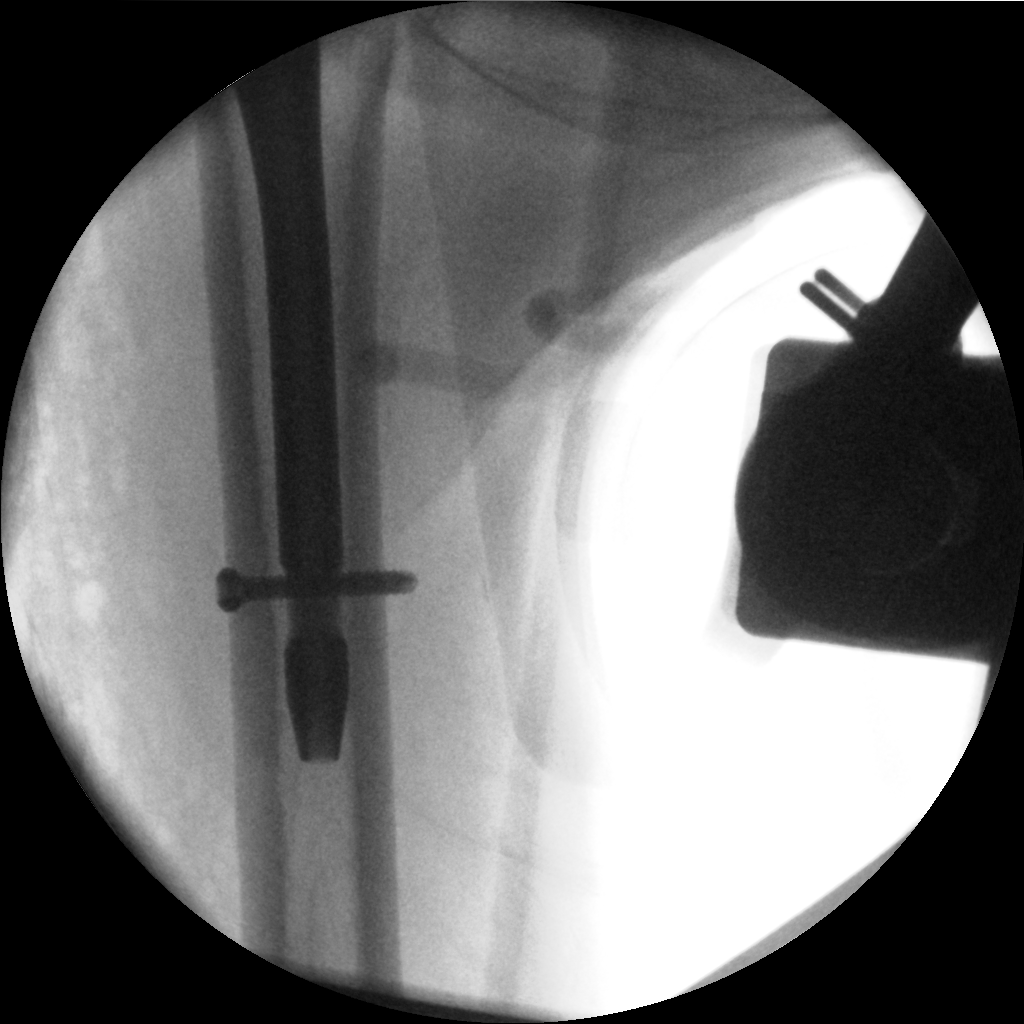

[cont. (2 of 4)]
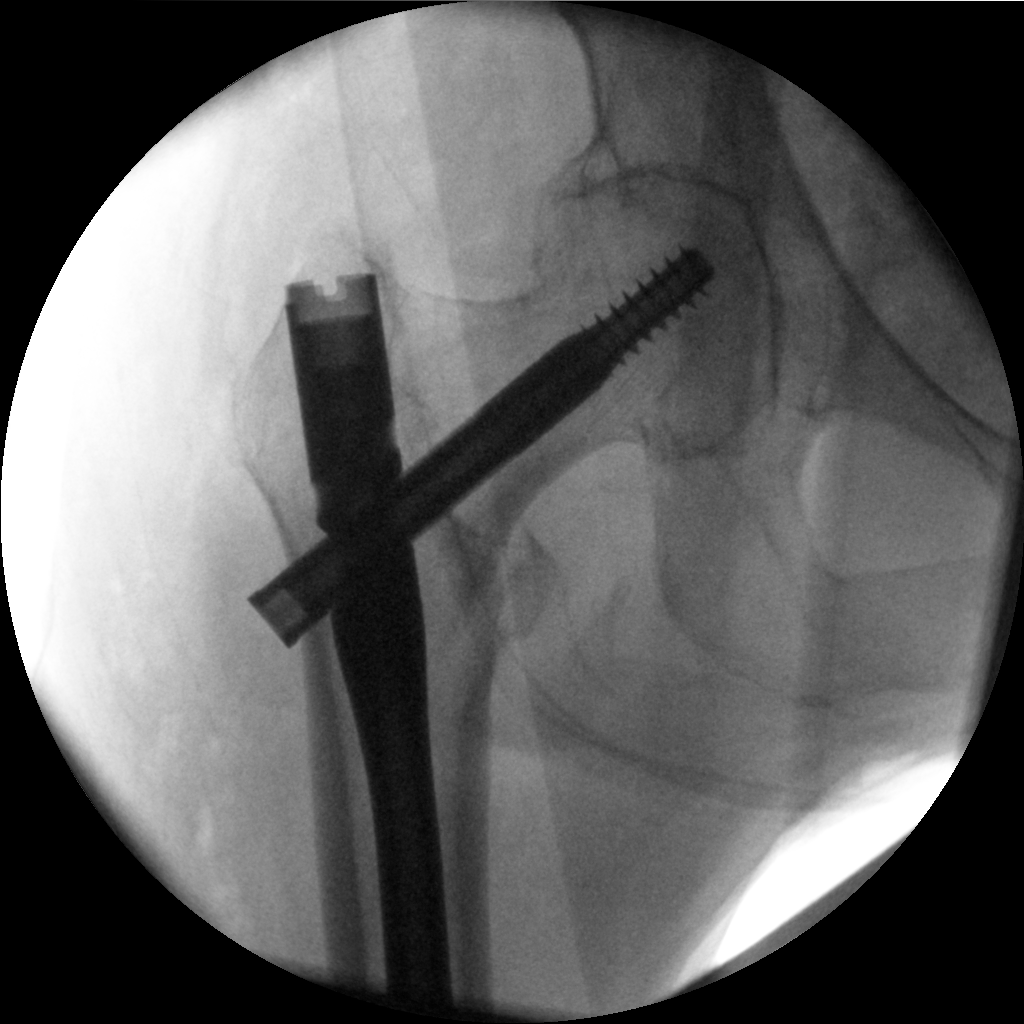

[cont. (3 of 4)]
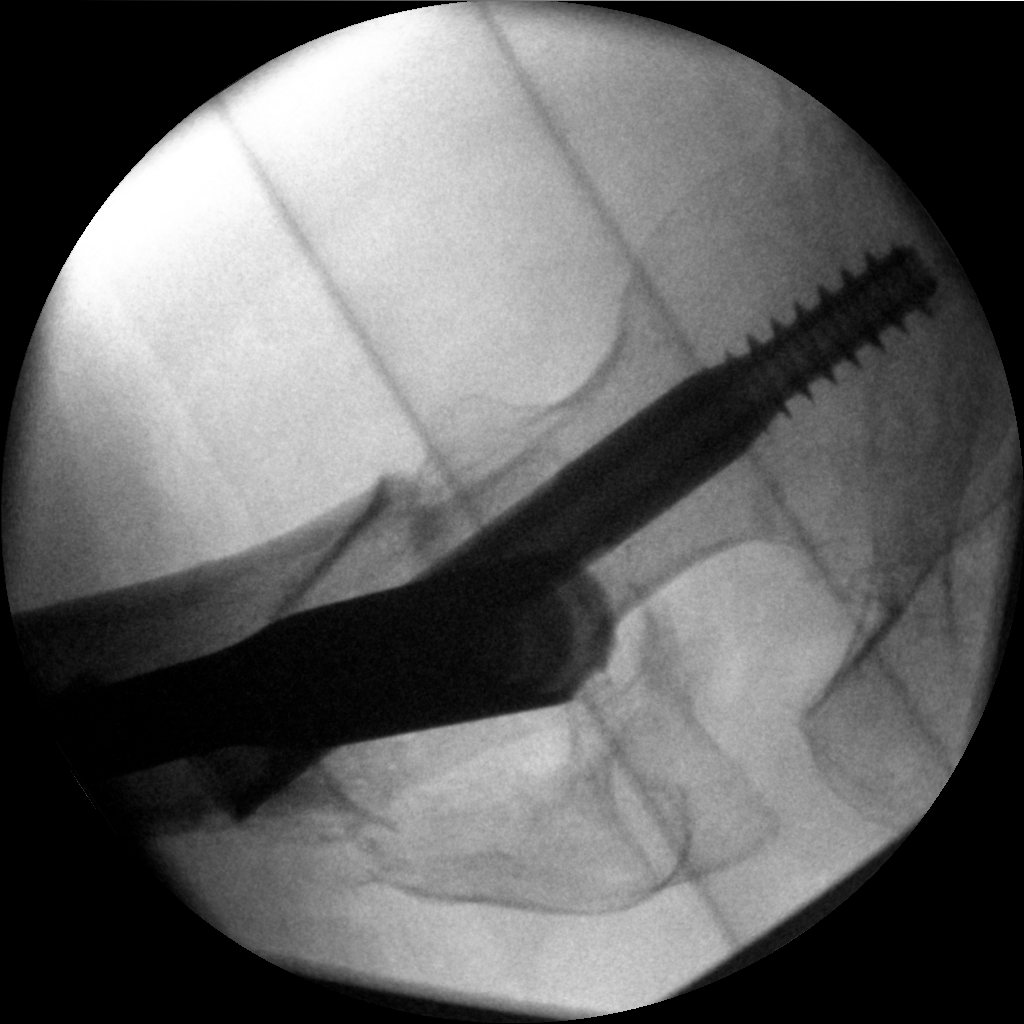

[cont. (4 of 4)]
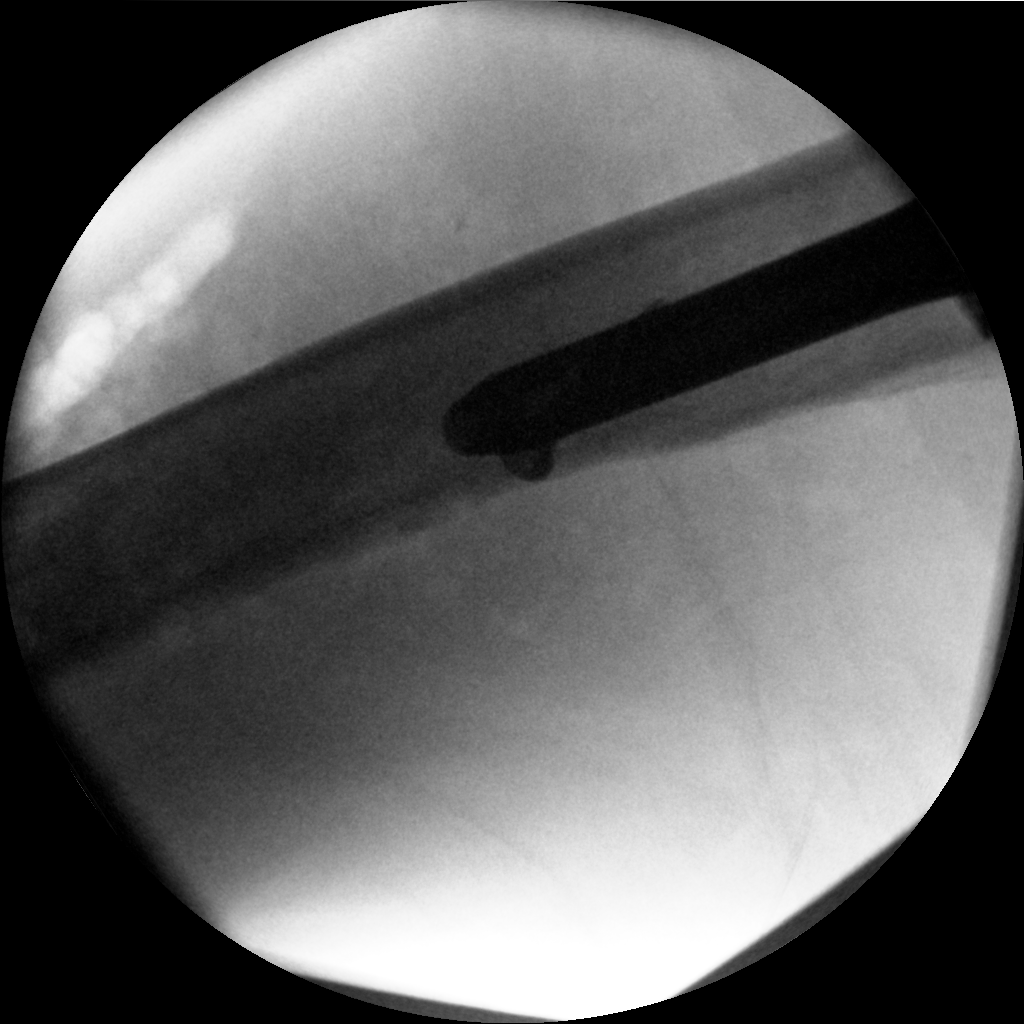

[4 of 4 positions shown; findings below may reference images not displayed]

FINDINGS: AP and lateral C-arm images demonstrate the patient has undergone
open reduction and internal fixation of the intertrochanteric
fracture of the proximal right femur. Intra medullary rod and
compression screw are in place. Alignment and position of the major
fragments is anatomic. Portions of the greater and lesser
trochanters are avulsed.
IMPRESSION: Open reduction and internal fixation of right hip fracture.

## 2017-03-24 NOTE — Telephone Encounter (Signed)
Error

## 2023-05-07 NOTE — Telephone Encounter (Signed)
error
